# Patient Record
Sex: Female | Born: 1990 | Race: White | Hispanic: No | Marital: Single | Smoking: Never smoker
Health system: Southern US, Community
[De-identification: ages and names within clinical notes are randomized; demographics above are authoritative.]

## PROBLEM LIST (undated history)

## (undated) DIAGNOSIS — J45909 Unspecified asthma, uncomplicated: Secondary | ICD-10-CM

## (undated) DIAGNOSIS — K589 Irritable bowel syndrome without diarrhea: Secondary | ICD-10-CM

## (undated) DIAGNOSIS — F329 Major depressive disorder, single episode, unspecified: Secondary | ICD-10-CM

## (undated) DIAGNOSIS — I10 Essential (primary) hypertension: Secondary | ICD-10-CM

## (undated) DIAGNOSIS — F32A Depression, unspecified: Secondary | ICD-10-CM

## (undated) DIAGNOSIS — Z6841 Body Mass Index (BMI) 40.0 and over, adult: Secondary | ICD-10-CM

## (undated) DIAGNOSIS — T7840XA Allergy, unspecified, initial encounter: Secondary | ICD-10-CM

## (undated) DIAGNOSIS — Z8619 Personal history of other infectious and parasitic diseases: Secondary | ICD-10-CM

## (undated) DIAGNOSIS — K921 Melena: Secondary | ICD-10-CM

## (undated) HISTORY — DX: Depression, unspecified: F32.A

## (undated) HISTORY — DX: Essential (primary) hypertension: I10

## (undated) HISTORY — DX: Allergy, unspecified, initial encounter: T78.40XA

## (undated) HISTORY — DX: Melena: K92.1

## (undated) HISTORY — DX: Major depressive disorder, single episode, unspecified: F32.9

## (undated) HISTORY — DX: Body Mass Index (BMI) 40.0 and over, adult: Z684

## (undated) HISTORY — DX: Irritable bowel syndrome, unspecified: K58.9

## (undated) HISTORY — DX: Personal history of other infectious and parasitic diseases: Z86.19

## (undated) HISTORY — DX: Unspecified asthma, uncomplicated: J45.909

---

## 2014-04-05 ENCOUNTER — Ambulatory Visit (INDEPENDENT_AMBULATORY_CARE_PROVIDER_SITE_OTHER): Payer: BLUE CROSS/BLUE SHIELD | Admitting: Family Medicine

## 2014-04-05 ENCOUNTER — Encounter: Payer: Self-pay | Admitting: Family Medicine

## 2014-04-05 VITALS — BP 160/98 | HR 88 | Temp 98.4°F | Ht 63.25 in | Wt 256.1 lb

## 2014-04-05 DIAGNOSIS — R062 Wheezing: Secondary | ICD-10-CM

## 2014-04-05 DIAGNOSIS — R0600 Dyspnea, unspecified: Secondary | ICD-10-CM

## 2014-04-05 DIAGNOSIS — I10 Essential (primary) hypertension: Secondary | ICD-10-CM

## 2014-04-05 DIAGNOSIS — Z7189 Other specified counseling: Secondary | ICD-10-CM

## 2014-04-05 DIAGNOSIS — Z7689 Persons encountering health services in other specified circumstances: Secondary | ICD-10-CM

## 2014-04-05 DIAGNOSIS — J069 Acute upper respiratory infection, unspecified: Secondary | ICD-10-CM

## 2014-04-05 MED ORDER — LISINOPRIL-HYDROCHLOROTHIAZIDE 20-25 MG PO TABS: 1.0000 | ORAL_TABLET | Freq: Every day | ORAL | Status: DC

## 2014-04-05 MED ORDER — MOMETASONE FUROATE 50 MCG/ACT NA SUSP: 2.0000 | Freq: Every day | NASAL | Status: DC

## 2014-04-05 MED ORDER — ALBUTEROL SULFATE HFA 108 (90 BASE) MCG/ACT IN AERS: 2.0000 | INHALATION_SPRAY | Freq: Four times a day (QID) | RESPIRATORY_TRACT | Status: DC | PRN

## 2014-04-05 NOTE — Patient Instructions (Signed)
BEFORE YOU LEAVE: -schedule morning appointment in 1 month - come fasting but drink plenty of water  Get a pill box and take your new blood pressure medication every day  We recommend the following healthy lifestyle measures: - eat a healthy diet consisting of lots of vegetables, fruits, beans, nuts, seeds, healthy meats such as white chicken and fish and whole grains.  - avoid fried foods, fast food, processed foods, sodas, red meet and other fattening foods.  - get a least 150 minutes of aerobic exercise per week.   -We placed a referral for you as discussed for the lung tesing. It usually takes about 1-2 weeks to process and schedule this referral. If you have not heard from us regarding this appointment in 2 weeks please contact our office.  nasonex daily

## 2014-04-05 NOTE — Progress Notes (Signed)
HPI:  Kristy Ross is here to establish care. Moved here from [place]. 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 Last PCP and physical: she did have a pap smear last April 2015 and normal  Has the following chronic problems that require follow up and concerns today:   Mild SOB and wheezing -started about a year ago after using kaboom, never official testing for asthma but told has asthma -cigarette smoke and VURI trigger symptoms - has a cold currently -uses alb prn - a few times per week when a round cigarettes -hx of allergies, takes zyrtec -denies: CP, SOB now, orthopnea -brother with asthma  HTN/Obesity/HLD: -has been on medications since since a child -her whole family has HTN -meds: hctz 12.5 and moexipril 7.5 - forgets to take meds several days per week but feels like not high enough dose because always high -when she exercises does not need medications so has been on and off -reports cholesterol check with insurance several months ago and a little high and advised -she is working on diet and exercise  Hx of Depression: -as a teenager -never hospitalized, no hx SI -resolved several years ago and no issues since  Hx IBS: -ok if watches diet   ROS negative for unless reported above: fevers, unintentional weight loss, hearing or vision loss, chest pain, palpitations, struggling to breath, hemoptysis, melena, hematochezia, hematuria, falls, loc, si, thoughts of self harm  Past Medical History  Diagnosis Date  . Hypertension   . Asthma   . Allergy   . Depression   . IBS (irritable bowel syndrome)   . History of chicken pox   . Blood in stool     No past surgical history on file.  Family History  Problem Relation Age of Onset  . Hypertension Father   . Breast cancer Mother   . Hyperlipidemia Maternal Grandfather   . Hyperlipidemia Paternal Grandmother   . Hyperlipidemia Paternal Grandfather   . Heart disease Father   . CVA Maternal Grandfather      History   Social History  . Marital Status: Single    Spouse Name: N/A    Number of Children: N/A  . Years of Education: N/A   Social History Main Topics  . Smoking status: Never Smoker   . Smokeless tobacco: None  . Alcohol Use: No  . Drug Use: No  . Sexual Activity: None   Other Topics Concern  . 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  albuterol (PROVENTIL HFA;VENTOLIN HFA) 108 (90 BASE) MCG/ACT inhaler, Inhale 2 puffs into the lungs every 6 (six) hours as needed for wheezing or shortness of breath., Disp: 1 Inhaler, Rfl: 6 .  cetirizine (ZYRTEC) 10 MG tablet, Take 10 mg by mouth daily., Disp: , Rfl:  .  CRANBERRY PO, Take by mouth., Disp: , Rfl:  .  lisinopril-hydrochlorothiazide (PRINZIDE,ZESTORETIC) 20-25 MG per tablet, Take 1 tablet by mouth daily., Disp: 90 tablet, Rfl: 3 .  mometasone (NASONEX) 50 MCG/ACT nasal spray, Place 2 sprays into the nose daily., Disp: 17 g, Rfl: 12 .  OLIVE LEAF PO, Take by mouth daily., Disp: , Rfl:   EXAM:  Filed Vitals:   04/05/14 1624  BP: 160/98  Pulse: 88  Temp: 98.4 F (36.9 C)  O2:98%  Body mass index is 44.98 kg/(m^2).  GENERAL: vitals reviewed and listed above, alert, oriented, appears well hydrated and in no acute distress  HEENT: atraumatic, conjunttiva clear, no obvious abnormalities on inspection of external nose and ears, normal appearance of ear canals and TMs, clear nasal congestion, mild post oropharyngeal erythema with PND, no tonsillar edema or exudate, no sinus TTP  NECK: no obvious masses on inspection  LUNGS: clear to auscultation bilaterally, no wheezes, rales or rhonchi, good air movement  CV: HRRR, no peripheral edema  MS: moves all extremities without noticeable  abnormality  PSYCH: pleasant and cooperative, no obvious depression or anxiety  ASSESSMENT AND PLAN:  Discussed the following assessment and plan:  Essential hypertension - Plan: lisinopril-hydrochlorothiazide (PRINZIDE,ZESTORETIC) 20-25 MG per tablet -follow up in 1 month, labs then -stressed importance of diet and exercise and taking medication every day  Dyspnea - Plan: Pulmonary function test Wheezing - Plan: Pulmonary function test -flonase dialy, refilled alb  Viral upper respiratory infection -supportive care  Establish care -We reviewed the PMH, PSH, FH, SH, Meds and Allergies. -We provided refills for any medications we will prescribe as needed. -We addressed current concerns per orders and patient instructions. -We have asked for records for pertinent exams, studies, vaccines and notes from previous providers. -We have advised patient to follow up per instructions below.   -Patient advised to return or notify a doctor immediately if symptoms worsen or persist or new concerns arise.  Patient Instructions  BEFORE YOU LEAVE: -schedule morning appointment in 1 month - come fasting but drink plenty of water  Get a pill box and take your new blood pressure medication every day  We recommend the following healthy lifestyle measures: - eat a healthy diet consisting of lots of vegetables, fruits, beans, nuts, seeds, healthy meats such as white chicken and fish and whole grains.  - avoid fried foods, fast food, processed foods, sodas, red meet and other fattening foods.  - get a least 150 minutes of aerobic exercise per week.   -We placed a referral for you as discussed for the lung tesing. It usually takes about 1-2 weeks to process and schedule this referral. If you have not heard from us regarding this appointment in 2 weeks please contact our office.  nasonex daily         Kristy Ross R.

## 2014-04-05 NOTE — Progress Notes (Signed)
Pre visit review using our clinic review tool, if applicable. No additional management support is needed unless otherwise documented below in the visit note. 

## 2014-04-08 ENCOUNTER — Telehealth: Payer: Self-pay | Admitting: Family Medicine

## 2014-04-08 NOTE — Telephone Encounter (Signed)
emmi emailed °

## 2014-04-22 ENCOUNTER — Ambulatory Visit (INDEPENDENT_AMBULATORY_CARE_PROVIDER_SITE_OTHER): Payer: BLUE CROSS/BLUE SHIELD | Admitting: Internal Medicine

## 2014-04-22 DIAGNOSIS — R0600 Dyspnea, unspecified: Secondary | ICD-10-CM

## 2014-04-22 DIAGNOSIS — R062 Wheezing: Secondary | ICD-10-CM

## 2014-04-22 LAB — PULMONARY FUNCTION TEST
DL/VA % pred: 141 %
DL/VA: 6.63 ml/min/mmHg/L
DLCO UNC: 31.23 ml/min/mmHg
DLCO unc % pred: 136 %
FEF 25-75 POST: 3.46 L/s
FEF 25-75 PRE: 3.47 L/s
FEF2575-%Change-Post: 0 %
FEF2575-%PRED-PRE: 97 %
FEF2575-%Pred-Post: 97 %
FEV1-%CHANGE-POST: 0 %
FEV1-%PRED-POST: 96 %
FEV1-%Pred-Pre: 96 %
FEV1-Post: 3.05 L
FEV1-Pre: 3.04 L
FEV1FVC-%Change-Post: 2 %
FEV1FVC-%Pred-Pre: 97 %
FEV6-%Change-Post: -2 %
FEV6-%PRED-PRE: 99 %
FEV6-%Pred-Post: 97 %
FEV6-POST: 3.55 L
FEV6-PRE: 3.63 L
FEV6FVC-%PRED-POST: 100 %
FEV6FVC-%PRED-PRE: 100 %
FVC-%CHANGE-POST: -2 %
FVC-%PRED-PRE: 99 %
FVC-%Pred-Post: 96 %
FVC-PRE: 3.63 L
FVC-Post: 3.55 L
PRE FEV6/FVC RATIO: 100 %
Post FEV1/FVC ratio: 86 %
Post FEV6/FVC ratio: 100 %
Pre FEV1/FVC ratio: 84 %
RV % pred: 75 %
RV: 0.93 L
TLC % PRED: 94 %
TLC: 4.65 L

## 2014-04-22 NOTE — Progress Notes (Signed)
PFT performed today. 

## 2014-05-09 ENCOUNTER — Ambulatory Visit: Payer: BLUE CROSS/BLUE SHIELD | Admitting: Family Medicine

## 2014-05-10 ENCOUNTER — Ambulatory Visit (INDEPENDENT_AMBULATORY_CARE_PROVIDER_SITE_OTHER): Payer: BLUE CROSS/BLUE SHIELD | Admitting: Family Medicine

## 2014-05-10 ENCOUNTER — Encounter: Payer: Self-pay | Admitting: Family Medicine

## 2014-05-10 VITALS — BP 110/82 | HR 107 | Temp 98.0°F | Ht 63.25 in | Wt 247.0 lb

## 2014-05-10 DIAGNOSIS — I10 Essential (primary) hypertension: Secondary | ICD-10-CM

## 2014-05-10 DIAGNOSIS — Z114 Encounter for screening for human immunodeficiency virus [HIV]: Secondary | ICD-10-CM

## 2014-05-10 DIAGNOSIS — E669 Obesity, unspecified: Secondary | ICD-10-CM

## 2014-05-10 DIAGNOSIS — Z23 Encounter for immunization: Secondary | ICD-10-CM

## 2014-05-10 LAB — BASIC METABOLIC PANEL
BUN: 13 mg/dL (ref 6–23)
CHLORIDE: 102 meq/L (ref 96–112)
CO2: 28 mEq/L (ref 19–32)
Calcium: 9.6 mg/dL (ref 8.4–10.5)
Creatinine, Ser: 0.8 mg/dL (ref 0.40–1.20)
GFR: 92.88 mL/min (ref >60.00)
GLUCOSE: 83 mg/dL (ref 70–99)
POTASSIUM: 3.9 meq/L (ref 3.5–5.1)
Sodium: 136 mEq/L (ref 135–145)

## 2014-05-10 LAB — HEMOGLOBIN A1C: Hgb A1c MFr Bld: 5.6 % (ref 4.6–6.5)

## 2014-05-10 LAB — LIPID PANEL
Cholesterol: 150 mg/dL (ref 0–200)
HDL: 41.8 mg/dL (ref >39.00)
LDL Cholesterol: 85 mg/dL (ref 0–99)
NONHDL: 108.2
Total CHOL/HDL Ratio: 4
Triglycerides: 117 mg/dL (ref 0.0–149.0)
VLDL: 23.4 mg/dL (ref 0.0–40.0)

## 2014-05-10 NOTE — Patient Instructions (Addendum)
BEFORE YOU LEAVE: -labs -Tdap -follow up in 3 months  Congratulations on the lifestyle changed  We recommend the following healthy lifestyle measures: - eat a healthy diet consisting of lots of vegetables, fruits, beans, nuts, seeds, healthy meats such as white chicken and fish and whole grains.  - avoid fried foods, fast food, processed foods, sodas, red meet and other fattening foods.  - get a least 150 minutes of aerobic exercise per week.

## 2014-05-10 NOTE — Progress Notes (Signed)
Pre visit review using our clinic review tool, if applicable. No additional management support is needed unless otherwise documented below in the visit note. 

## 2014-05-10 NOTE — Addendum Note (Signed)
Addended by: Johnella MoloneyFUNDERBURK, JO A on: 05/10/2014 09:48 AM   Modules accepted: Orders

## 2014-05-10 NOTE — Progress Notes (Signed)
HPI:  Kristy Ross is a 24 yo pleasant F whom moved here in 2015 from [place]. She is here for follow up of several issues:  HTN: -meds: lisinopril-hctz 20-25 -long hx of HTN and strong FH -denies: CP, palpitations, swelling, HA, vision changes  Allergies/Dyspnea/Wheezing: -wheezing when exposed to smoke or or if exercises outside in the cold -informal dx mild intermittent asthma when came to me on alb prn and zyrtec for allergies -added INS and ordered PFT 03/2014; reviewed today -brother has asthma -denies: SOB, DOE, CP today  Obesity: -working on diet and exercise -9lb weight loss  HM: -HIV: wants to do today -pap: done -tdap: wants to do today  ROS: See pertinent positives and negatives per HPI.  Past Medical History  Diagnosis Date  . Hypertension   . Asthma   . Allergy   . Depression   . IBS (irritable bowel syndrome)   . History of chicken pox   . Blood in stool     No past surgical history on file.  Family History  Problem Relation Age of Onset  . Hypertension Father   . Breast cancer Mother   . Hyperlipidemia Maternal Grandfather   . Hyperlipidemia Paternal Grandmother   . Hyperlipidemia Paternal Grandfather   . Heart disease Father   . CVA Maternal Grandfather     History   Social History  . Marital Status: Single    Spouse Name: N/A  . Number of Children: N/A  . Years of Education: N/A   Social History Main Topics  . Smoking status: Never Smoker   . Smokeless tobacco: Not on file  . Alcohol Use: No  . Drug Use: No  . Sexual Activity: Not on file   Other Topics Concern  . 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  albuterol (PROVENTIL HFA;VENTOLIN HFA) 108 (90 BASE) MCG/ACT inhaler, Inhale 2 puffs  into the lungs every 6 (six) hours as needed for wheezing or shortness of breath., Disp: 1 Inhaler, Rfl: 6 .  cetirizine (ZYRTEC) 10 MG tablet, Take 10 mg by mouth daily., Disp: , Rfl:  .  CRANBERRY PO, Take by mouth., Disp: , Rfl:  .  lisinopril-hydrochlorothiazide (PRINZIDE,ZESTORETIC) 20-25 MG per tablet, Take 1 tablet by mouth daily., Disp: 90 tablet, Rfl: 3 .  mometasone (NASONEX) 50 MCG/ACT nasal spray, Place 2 sprays into the nose daily., Disp: 17 g, Rfl: 12  EXAM:  Filed Vitals:   05/10/14 0907  BP: 110/82  Pulse: 107  Temp: 98 F (36.7 C)    Body mass index is 43.38 kg/(m^2).  GENERAL: vitals reviewed and listed above, alert, oriented, appears well hydrated and in no acute distress  HEENT: atraumatic, conjunttiva clear, no obvious abnormalities on inspection of external nose and ears  NECK: no obvious masses on inspection  LUNGS: clear to auscultation bilaterally, no wheezes, rales or rhonchi, good air movement  CV: HRRR, no peripheral edema  MS: moves all extremities without noticeable abnormality  PSYCH: pleasant and cooperative, no obvious depression or anxiety  ASSESSMENT AND PLAN:  Discussed the following assessment and plan:  Obesity - Plan: Lipid Panel, Hemoglobin A1c  Essential hypertension - Plan: Basic  metabolic panel  Encounter for screening for HIV - Plan: HIV antibody (with reflex)  -congratulated on lifestyle changes -labs today -Patient advised to return or notify a doctor immediately if symptoms worsen or persist or new concerns arise.  Patient Instructions  BEFORE YOU LEAVE: -labs -follow up in 3 months  Congratulations on the lifestyle changed  We recommend the following healthy lifestyle measures: - eat a healthy diet consisting of lots of vegetables, fruits, beans, nuts, seeds, healthy meats such as white chicken and fish and whole grains.  - avoid fried foods, fast food, processed foods, sodas, red meet and other fattening foods.   - get a least 150 minutes of aerobic exercise per week.       Kriste BasqueKIM, Shabria Egley R.

## 2014-05-11 LAB — HIV ANTIBODY (ROUTINE TESTING W REFLEX): HIV 1&2 Ab, 4th Generation: NONREACTIVE

## 2014-08-10 ENCOUNTER — Ambulatory Visit (INDEPENDENT_AMBULATORY_CARE_PROVIDER_SITE_OTHER): Payer: BLUE CROSS/BLUE SHIELD | Admitting: Family Medicine

## 2014-08-10 ENCOUNTER — Encounter: Payer: Self-pay | Admitting: Family Medicine

## 2014-08-10 VITALS — BP 124/90 | HR 97 | Temp 97.6°F | Ht 63.25 in | Wt 256.6 lb

## 2014-08-10 DIAGNOSIS — Z9109 Other allergy status, other than to drugs and biological substances: Secondary | ICD-10-CM

## 2014-08-10 DIAGNOSIS — Z91048 Other nonmedicinal substance allergy status: Secondary | ICD-10-CM

## 2014-08-10 DIAGNOSIS — I10 Essential (primary) hypertension: Secondary | ICD-10-CM

## 2014-08-10 DIAGNOSIS — N926 Irregular menstruation, unspecified: Secondary | ICD-10-CM | POA: Diagnosis not present

## 2014-08-10 DIAGNOSIS — J4521 Mild intermittent asthma with (acute) exacerbation: Secondary | ICD-10-CM

## 2014-08-10 DIAGNOSIS — E669 Obesity, unspecified: Secondary | ICD-10-CM

## 2014-08-10 LAB — POCT URINE PREGNANCY: Preg Test, Ur: NEGATIVE

## 2014-08-10 NOTE — Progress Notes (Signed)
HPI:  HTN: -meds: lisinopril-hctz 20-25 -long hx of HTN and strong FH -denies: CP, palpitations, swelling, HA, vision changes  Allergies/Dyspnea/Wheezing: -wheezing when exposed to smoke or or if exercises outside in the cold -informal dx mild intermittent asthma when came to me on alb prn and zyrtec for allergies -added INS and ordered PFT 03/2014; reviewed today -brother has asthma -denies: SOB, DOE, CP today  Obesity: -working on exercise but has not been doing well with the diet -exercising 3 times per week and eating healthy  Irr menstrual bleeding: -FDLMP: April 3rd -has skipped periods before but this is rare -due for her gyn exam as well -has not been sexually active in years -no weight changes sig other then some loss from exercise - exercising a lot more, no spotting, no breast discharge or any other symptoms   ROS: See pertinent positives and negatives per HPI.  Past Medical History  Diagnosis Date  . Hypertension   . Asthma   . Allergy   . Depression   . IBS (irritable bowel syndrome)   . History of chicken pox   . Blood in stool     No past surgical history on file.  Family History  Problem Relation Age of Onset  . Hypertension Father   . Breast cancer Mother   . Hyperlipidemia Maternal Grandfather   . Hyperlipidemia Paternal Grandmother   . Hyperlipidemia Paternal Grandfather   . Heart disease Father   . CVA Maternal Grandfather     History   Social History  . Marital Status: Single    Spouse Name: N/A  . Number of Children: N/A  . Years of Education: N/A   Social History Main Topics  . Smoking status: Never Smoker   . Smokeless tobacco: Not on file  . Alcohol Use: No  . Drug Use: No  . Sexual Activity: Not on file   Other Topics Concern  . 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  albuterol (PROVENTIL HFA;VENTOLIN HFA) 108 (90 BASE) MCG/ACT inhaler, Inhale 2 puffs into the lungs every 6 (six) hours as needed for wheezing or shortness of breath., Disp: 1 Inhaler, Rfl: 6 .  cetirizine (ZYRTEC) 10 MG tablet, Take 10 mg by mouth daily., Disp: , Rfl:  .  fluticasone (FLONASE) 50 MCG/ACT nasal spray, Place into both nostrils daily., Disp: , Rfl:  .  lisinopril-hydrochlorothiazide (PRINZIDE,ZESTORETIC) 20-25 MG per tablet, Take 1 tablet by mouth daily., Disp: 90 tablet, Rfl: 3  EXAM:  Filed Vitals:   08/10/14 0854  BP: 124/90  Pulse: 97  Temp: 97.6 F (36.4 C)    Body mass index is 45.07 kg/(m^2).  GENERAL: vitals reviewed and listed above, alert, oriented, appears well hydrated and in no acute distress  HEENT: atraumatic, conjunttiva clear, no obvious abnormalities on inspection of external nose and ears  NECK: no obvious masses on inspection  LUNGS: clear to auscultation bilaterally, no wheezes, rales or rhonchi, good air movement  CV: HRRR, no peripheral edema  MS: moves all extremities without noticeable abnormality  PSYCH: pleasant and cooperative, no obvious depression or anxiety  ASSESSMENT AND PLAN:  Discussed the following assessment and plan:  Essential hypertension -stable  Environmental allergies  Asthma, mild intermittent  Obesity -lifestyle recs  Irregular menstrual bleeding - Plan: POCT urine pregnancy -upreg -she wants to do eval and gyn exam with a gynecologist - information provided  -Patient advised to return or notify a doctor immediately if symptoms worsen or persist or new concerns arise.  Patient Instructions  BEFORE YOU LEAVE: -schedule follow up in 4-6 months  We recommend the following healthy lifestyle measures: - eat a healthy diet consisting of lots of vegetables, fruits, beans, nuts, seeds, healthy meats such as white  chicken and fish   - avoid starches, sweets, fried foods, fast food, processed foods, sodas, red meet and other fattening foods.  - get a least 150 minutes of aerobic exercise per week.       Kristy BasqueKIM, HANNAH R.

## 2014-08-10 NOTE — Patient Instructions (Signed)
BEFORE YOU LEAVE: -schedule follow up in 4-6 months  We recommend the following healthy lifestyle measures: - eat a healthy diet consisting of lots of vegetables, fruits, beans, nuts, seeds, healthy meats such as white chicken and fish   - avoid starches, sweets, fried foods, fast food, processed foods, sodas, red meet and other fattening foods.  - get a least 150 minutes of aerobic exercise per week.

## 2014-08-10 NOTE — Progress Notes (Signed)
Pre visit review using our clinic review tool, if applicable. No additional management support is needed unless otherwise documented below in the visit note. 

## 2015-04-29 ENCOUNTER — Other Ambulatory Visit: Payer: Self-pay | Admitting: Family Medicine

## 2015-05-15 ENCOUNTER — Other Ambulatory Visit: Payer: Self-pay | Admitting: Family Medicine

## 2015-09-08 ENCOUNTER — Encounter: Payer: Self-pay | Admitting: Family Medicine

## 2015-09-08 ENCOUNTER — Encounter: Payer: BLUE CROSS/BLUE SHIELD | Admitting: Family Medicine

## 2015-09-08 ENCOUNTER — Ambulatory Visit (INDEPENDENT_AMBULATORY_CARE_PROVIDER_SITE_OTHER): Payer: BLUE CROSS/BLUE SHIELD | Admitting: Family Medicine

## 2015-09-08 VITALS — BP 102/80 | HR 85 | Temp 98.5°F | Ht 62.5 in | Wt 241.9 lb

## 2015-09-08 DIAGNOSIS — I10 Essential (primary) hypertension: Secondary | ICD-10-CM

## 2015-09-08 DIAGNOSIS — J309 Allergic rhinitis, unspecified: Secondary | ICD-10-CM | POA: Diagnosis not present

## 2015-09-08 DIAGNOSIS — Z0000 Encounter for general adult medical examination without abnormal findings: Secondary | ICD-10-CM | POA: Diagnosis not present

## 2015-09-08 DIAGNOSIS — E669 Obesity, unspecified: Secondary | ICD-10-CM

## 2015-09-08 DIAGNOSIS — J4521 Mild intermittent asthma with (acute) exacerbation: Secondary | ICD-10-CM

## 2015-09-08 LAB — BASIC METABOLIC PANEL
BUN: 12 mg/dL (ref 6–23)
CHLORIDE: 101 meq/L (ref 96–112)
CO2: 27 meq/L (ref 19–32)
Calcium: 9.9 mg/dL (ref 8.4–10.5)
Creatinine, Ser: 0.8 mg/dL (ref 0.40–1.20)
GFR: 91.91 mL/min (ref >60.00)
Glucose, Bld: 101 mg/dL — ABNORMAL HIGH (ref 70–99)
POTASSIUM: 3.7 meq/L (ref 3.5–5.1)
Sodium: 137 mEq/L (ref 135–145)

## 2015-09-08 LAB — CBC WITH DIFFERENTIAL/PLATELET
BASOS ABS: 0.1 10*3/uL (ref 0.0–0.1)
BASOS PCT: 0.8 % (ref 0.0–3.0)
Eosinophils Absolute: 0.8 10*3/uL — ABNORMAL HIGH (ref 0.0–0.7)
Eosinophils Relative: 8.3 % — ABNORMAL HIGH (ref 0.0–5.0)
HEMATOCRIT: 44.5 % (ref 36.0–46.0)
Hemoglobin: 15.2 g/dL — ABNORMAL HIGH (ref 12.0–15.0)
LYMPHS PCT: 24.8 % (ref 12.0–46.0)
Lymphs Abs: 2.5 10*3/uL (ref 0.7–4.0)
MCHC: 34.2 g/dL (ref 30.0–36.0)
MCV: 92.5 fl (ref 78.0–100.0)
MONOS PCT: 5.8 % (ref 3.0–12.0)
Monocytes Absolute: 0.6 10*3/uL (ref 0.1–1.0)
NEUTROS ABS: 6.2 10*3/uL (ref 1.4–7.7)
Neutrophils Relative %: 60.3 % (ref 43.0–77.0)
Platelets: 330 10*3/uL (ref 150.0–400.0)
RBC: 4.81 Mil/uL (ref 3.87–5.11)
RDW: 12.6 % (ref 11.5–15.5)
WBC: 10.2 10*3/uL (ref 4.0–10.5)

## 2015-09-08 MED ORDER — ALBUTEROL SULFATE (2.5 MG/3ML) 0.083% IN NEBU
2.5000 mg | INHALATION_SOLUTION | Freq: Once | RESPIRATORY_TRACT | Status: AC
  Administered 2015-09-08: 2.5 mg via RESPIRATORY_TRACT

## 2015-09-08 MED ORDER — HYDROCHLOROTHIAZIDE 25 MG PO TABS: 25.0000 mg | ORAL_TABLET | Freq: Every day | ORAL | Status: DC

## 2015-09-08 MED ORDER — ALBUTEROL SULFATE HFA 108 (90 BASE) MCG/ACT IN AERS: INHALATION_SPRAY | RESPIRATORY_TRACT | Status: DC

## 2015-09-08 NOTE — Progress Notes (Signed)
Pre visit review using our clinic review tool, if applicable. No additional management support is needed unless otherwise documented below in the visit note. 

## 2015-09-08 NOTE — Patient Instructions (Addendum)
BEFORE YOU LEAVE: -albuterol treatment -labs -nurse visit in 1 month to check blood pressure; schedule follow up in 6 months  Stop current blood pressure medication. New prescription for hydrochlorothiazide sent to pharmacy.  Congratulations on the healthy lifestyle changes!!!  Restart zyrtec.  Use the albuterol as needed and follow up if any worsening or persistent symptoms.  Vit D3 [phone] IU daily  We recommend the following healthy lifestyle measures: - eat a healthy whole foods diet consisting of regular small meals composed of vegetables, fruits, beans, nuts, seeds, healthy meats such as white chicken and fish and whole grains.  - avoid sweets, white starchy foods, fried foods, fast food, processed foods, sodas, red meet and other fattening foods.  - get a least 150-300 minutes of aerobic exercise per week.   We have ordered labs or studies at this visit. It can take up to 1-2 weeks for results and processing. IF results require follow up or explanation, we will call you with instructions. Clinically stable results will be released to your [facility]. If you have not heard from us or cannot find your results in [facility] in 2 weeks please contact our office at [phone].  If you are not yet signed up for [facility], please consider signing up.

## 2015-09-08 NOTE — Progress Notes (Signed)
HPI:  Here for CPE:  -Concerns and/or follow up today:   HTN: -meds: lisinopril-hctz 20-25 -long hx of HTN and strong FH; on BP meds since 8th grade -denies: CP, palpitations, swelling, HA, vision changes  Allergies/Dyspnea/Wheezing: -informal dx mild intermittent asthma when came to me on alb prn and zyrtec for allergies -added INS and ordered PFT 03/2014 -brother has asthma -ran out of zyrtec and has allergy issues this time of year, had some wheezing last night and is out of albuterol -denies: SOB, DOE, CP today  Obesity: -started wt watchers 3 months ago and is very happy has lost 20 lbs -exercising 2-5 days per week  -Diet: variety of foods, balance and well rounded, larger portion sizes  -Exercise: no regular exercise  -Taking folic acid, vitamin D or calcium: no  -Diabetes and Dyslipidemia Screening: done last year and at wrk every fall - neg per her report  -Hx of HTN: no  -Vaccines: UTD  -pap history: 2015, normal - all normal since age 1  -FDLMP: 09/07/15  -sexual activity: no  -wants STI testing (Hep C if born [birth year]): no  -FH breast, colon or ovarian ca: see FH Last mammogram: n/a Last colon cancer screening: n/a  Breast Ca Risk Assessment: -mother had breast ca, she had genetic testing which was negative, pt does self breast exams, declines CBE today.  -Alcohol, Tobacco, drug use: see social history  Review of Systems - no fevers, unintentional weight loss, vision loss, hearing loss, chest pain, sob, hemoptysis, melena, hematochezia, hematuria, genital discharge, changing or concerning skin lesions, bleeding, bruising, loc, thoughts of self harm or SI  Past Medical History  Diagnosis Date  . Hypertension   . Asthma   . Allergy   . Depression   . IBS (irritable bowel syndrome)   . History of chicken pox   . Blood in stool     No past surgical history on file.  Family History  Problem Relation Age of Onset  . Hypertension Father   .  Breast cancer Mother   . Hyperlipidemia Maternal Grandfather   . Hyperlipidemia Paternal Grandmother   . Hyperlipidemia Paternal Grandfather   . Heart disease Father   . CVA Maternal Grandfather     Social History   Social History  . Marital Status: Single    Spouse Name: N/A  . Number of Children: N/A  . Years of Education: N/A   Social History Main Topics  . Smoking status: Never Smoker   . Smokeless tobacco: None  . Alcohol Use: No  . Drug Use: No  . Sexual Activity: Not Asked   Other Topics Concern  . 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  albuterol (PROAIR HFA) 108 (90 Base) MCG/ACT inhaler, INHALE TWO PUFFS BY MOUTH EVERY SIX HOURS AS NEEDED FOR WHEEZING OR SHORTNESS OF BREATH., Disp: 8.5 Inhaler, Rfl: 5 .  cetirizine (ZYRTEC) 10 MG tablet, Take 10 mg by mouth daily., Disp: , Rfl:  .  fluticasone (FLONASE) 50 MCG/ACT nasal spray, Place into both nostrils daily., Disp: , Rfl:  .  hydrochlorothiazide (HYDRODIURIL) 25 MG tablet, Take 1 tablet (25 mg total) by mouth daily., Disp:  90 tablet, Rfl: 3  EXAM:  Filed Vitals:   09/08/15 0815  BP: 102/80  Pulse: 85  Temp: 98.5 F (36.9 C)    GENERAL: vitals reviewed and listed below, alert, oriented, appears well hydrated and in no acute distress  HEENT: head atraumatic, PERRLA, normal appearance of eyes, ears, nose and mouth. moist mucus membranes.  NECK: supple, no masses or lymphadenopathy  LUNGS: difuse exp wheezing; normal exam o/w, O2 sats normal  CV: HRRR, no peripheral edema or cyanosis, normal pedal pulses  BREAST: declined  ABDOMEN: bowel sounds normal, soft, non tender to palpation, no masses, no rebound or guarding  GU: declined  SKIN: no rash or abnormal  lesions; declined full skin exam  MS: normal gait, moves all extremities normally  NEURO: CN II-XII grossly intact, normal muscle strength and sensation to light touch on extremities  PSYCH: normal affect, pleasant and cooperative  ASSESSMENT AND PLAN:  Discussed the following assessment and plan:  1. Visit for preventive health examination -Discussed and advised all [place] preventive services health task force level A and B recommendations for age, sex and risks. -Advised at least 150 minutes of exercise per week and a healthy diet low in saturated fats and sweets and consisting of fresh fruits and vegetables, lean meats such as fish and white chicken and whole grains. -FASTING labs, studies and vaccines per orders this encounter -advised self breast exams, advised pap next year  2. Asthma with acute exacerbation, mild intermittent - neb tx with resolution wheezing, feels well -alb rx for prn use -restart zyrtec -follow up advised if not resolved with tx, worsening or any concerns -stop lisinopril  3. Allergic rhinitis, unspecified allergic rhinitis type -restart zyrtec  4. Obesity -congratulated on lifestyle changes -encouraged lifelong commitment to healthy lifestyle  5. Essential hypertension - Basic metabolic panel - CBC with Differential/Platelets -stop lisinopril with asthma; may not need - recheck in 1 month    Orders Placed This Encounter  Procedures  . Basic metabolic panel  . CBC with Differential/Platelets    Patient advised to return to clinic immediately if symptoms worsen or persist or new concerns.  Patient Instructions  BEFORE YOU LEAVE: -albuterol treatment -labs -nurse visit in 1 month to check blood pressure; schedule follow up in 6 months  Stop current blood pressure medication. New prescription for hydrochlorothiazide sent to pharmacy.  Congratulations on the healthy lifestyle changes!!!  Restart zyrtec.  Use the albuterol as needed and  follow up if any worsening or persistent symptoms.  Vit D3 [id number] IU daily  We recommend the following healthy lifestyle measures: - eat a healthy whole foods diet consisting of regular small meals composed of vegetables, fruits, beans, nuts, seeds, healthy meats such as white chicken and fish and whole grains.  - avoid sweets, white starchy foods, fried foods, fast food, processed foods, sodas, red meet and other fattening foods.  - get a least 150-300 minutes of aerobic exercise per week.   We have ordered labs or studies at this visit. It can take up to 1-2 weeks for results and processing. IF results require follow up or explanation, we will call you with instructions. Clinically stable results will be released to your [facility]. If you have not heard from us or cannot find your results in [facility] in 2 weeks please contact our office at [phone].  If you are not yet signed up for [facility], please consider signing up.           No Follow-up  on file.  Kriste BasqueKIM, Ondrea Dow R., DO

## 2015-09-08 NOTE — Addendum Note (Signed)
Addended by: Johnella MoloneyFUNDERBURK, Delena Casebeer A on: 09/08/2015 09:03 AM   Modules accepted: Orders

## 2015-10-06 ENCOUNTER — Ambulatory Visit (INDEPENDENT_AMBULATORY_CARE_PROVIDER_SITE_OTHER): Payer: BLUE CROSS/BLUE SHIELD | Admitting: *Deleted

## 2015-10-06 VITALS — BP 140/92 | HR 90

## 2015-10-06 DIAGNOSIS — I10 Essential (primary) hypertension: Secondary | ICD-10-CM

## 2015-10-06 MED ORDER — HYDROCHLOROTHIAZIDE 25 MG PO TABS: 25.0000 mg | ORAL_TABLET | Freq: Every day | ORAL | 3 refills | Status: DC

## 2015-10-06 MED ORDER — LOSARTAN POTASSIUM 50 MG PO TABS: 50.0000 mg | ORAL_TABLET | Freq: Every day | ORAL | 3 refills | Status: DC

## 2015-10-06 NOTE — Progress Notes (Signed)
HPI:  Follow up HTN: -longstanding -here for nurse check, stopped lisinopril last visit as bp low an asthma symptoms -reports breathing much better of lisinopril, howevere BP up at nurse visit and going out of town so worked her in today -denies: CP, SOB, HA, vision changes  ROS: See pertinent positives and negatives per HPI.  Past Medical History:  Diagnosis Date  . Allergy   . Asthma   . Blood in stool   . Depression   . History of chicken pox   . Hypertension   . IBS (irritable bowel syndrome)     No past surgical history on file.  Family History  Problem Relation Age of Onset  . Hypertension Father   . Breast cancer Mother   . Hyperlipidemia Maternal Grandfather   . Hyperlipidemia Paternal Grandmother   . Hyperlipidemia Paternal Grandfather   . Heart disease Father   . CVA Maternal Grandfather     Social History   Social History  . Marital status: Single    Spouse name: N/A  . Number of children: N/A  . Years of education: N/A   Social History Main Topics  . Smoking status: Never Smoker  . Smokeless tobacco: Not on file  . Alcohol use No  . Drug use: No  . Sexual activity: Not on file   Other Topics Concern  . 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  albuterol (PROAIR HFA) 108 (90 Base) MCG/ACT inhaler, INHALE TWO PUFFS BY MOUTH EVERY SIX HOURS AS NEEDED FOR WHEEZING OR SHORTNESS OF BREATH., Disp: 8.5 Inhaler, Rfl: 5 .  cetirizine (ZYRTEC) 10 MG tablet, Take 10 mg by mouth daily., Disp: , Rfl:  .  fluticasone (FLONASE) 50 MCG/ACT nasal spray, Place into both nostrils daily., Disp: , Rfl:  .  hydrochlorothiazide (HYDRODIURIL) 25 MG tablet, Take 1 tablet (25 mg total) by mouth daily., Disp: 90 tablet, Rfl:  3 .  losartan (COZAAR) 50 MG tablet, Take 1 tablet (50 mg total) by mouth daily., Disp: 90 tablet, Rfl: 3  EXAM:  Vitals:   10/06/15 1001  BP: (!) 140/92  Pulse: 90    There is no height or weight on file to calculate BMI.  GENERAL: vitals reviewed and listed above, alert, oriented, appears well hydrated and in no acute distress  HEENT: atraumatic, conjunttiva clear, no obvious abnormalities on inspection of external nose and ears  NECK: no obvious masses on inspection  LUNGS: clear to auscultation bilaterally, no wheezes, rales or rhonchi, good air movement  CV: HRRR, no peripheral edema  MS: moves all extremities without noticeable abnormality  PSYCH: pleasant and cooperative, no obvious depression or anxiety  ASSESSMENT AND PLAN:  Discussed the following assessment and plan:  Essential hypertension  -discussed options and risks -opted to add losartan - lifestyle recs -follow up 1-2 months -Patient advised to return or notify a doctor immediately if symptoms worsen or persist or new concerns arise.  Patient Instructions  BEFORE YOU LEAVE: -follow up: 1-2 months   Start the Losartan 50 mg and take once daily.  Continue the Hctz.  We recommend the following healthy lifestyle: 1) Small portions - eat  off of salad plate instead of dinner plate 2) Eat a healthy clean diet with avoidance of (less then 1 serving per week) processed foods, sweetened drinks, white starches, red meat, fast foods and sweets and consisting of: * 5-9 servings per day of fresh or frozen fruits and vegetables (not corn or potatoes, not dried or canned) *nuts and seeds, beans *olives and olive oil *small portions of lean meats such as fish and white chicken  *small portions of whole grains 3)Get at least 150 minutes of sweaty aerobic exercise per week 4)reduce stress - counseling, meditation, relaxation to balance other aspects of your life     Kriste Basque R., DO

## 2015-10-06 NOTE — Patient Instructions (Addendum)
BEFORE YOU LEAVE: -follow up: 1-2 months   Start the Losartan 50 mg and take once daily.  Continue the Hctz.  We recommend the following healthy lifestyle: 1) Small portions - eat off of salad plate instead of dinner plate 2) Eat a healthy clean diet with avoidance of (less then 1 serving per week) processed foods, sweetened drinks, white starches, red meat, fast foods and sweets and consisting of: * 5-9 servings per day of fresh or frozen fruits and vegetables (not corn or potatoes, not dried or canned) *nuts and seeds, beans *olives and olive oil *small portions of lean meats such as fish and white chicken  *small portions of whole grains 3)Get at least 150 minutes of sweaty aerobic exercise per week 4)reduce stress - counseling, meditation, relaxation to balance other aspects of your life

## 2015-12-04 ENCOUNTER — Encounter: Payer: Self-pay | Admitting: Family Medicine

## 2015-12-04 ENCOUNTER — Ambulatory Visit (INDEPENDENT_AMBULATORY_CARE_PROVIDER_SITE_OTHER): Payer: BLUE CROSS/BLUE SHIELD | Admitting: Family Medicine

## 2015-12-04 VITALS — BP 102/76 | HR 98 | Temp 98.5°F | Ht 62.5 in | Wt 236.0 lb

## 2015-12-04 DIAGNOSIS — J4520 Mild intermittent asthma, uncomplicated: Secondary | ICD-10-CM | POA: Diagnosis not present

## 2015-12-04 DIAGNOSIS — Z6841 Body Mass Index (BMI) 40.0 and over, adult: Secondary | ICD-10-CM | POA: Diagnosis not present

## 2015-12-04 DIAGNOSIS — I10 Essential (primary) hypertension: Secondary | ICD-10-CM

## 2015-12-04 DIAGNOSIS — R739 Hyperglycemia, unspecified: Secondary | ICD-10-CM

## 2015-12-04 DIAGNOSIS — J302 Other seasonal allergic rhinitis: Secondary | ICD-10-CM

## 2015-12-04 HISTORY — DX: Body Mass Index (BMI) 40.0 and over, adult: Z684

## 2015-12-04 NOTE — Patient Instructions (Signed)
BEFORE YOU LEAVE: -follow up: 3-4 months  keep up the great work!  May cut one blood pressure medication in half if you feel blood pressure is running low.   We recommend the following healthy lifestyle for LIFE: 1) Small portions.   Tip: eat off of a salad plate instead of a dinner plate.  Tip: It is ok to feel hungry after a meal - that likely means you ate an appropriate portion.  Tip: if you need more or a snack choose fruits, veggies and/or a handful of nuts or seeds.  2) Eat a healthy clean diet.  * Tip: Avoid (less then 1 serving per week): processed foods, sweets, sweetened drinks, white starches (rice, flour, bread, potatoes, pasta, etc), red meat, fast foods, butter  *Tip: CHOOSE instead   * 5-9 servings per day of fresh or frozen fruits and vegetables (but not corn, potatoes, bananas, canned or dried fruit)   *nuts and seeds, beans   *olives and olive oil   *small portions of lean meats such as fish and white chicken    *small portions of whole grains  3)Get at least 150 minutes of sweaty aerobic exercise per week.  4)Reduce stress - consider counseling, meditation and relaxation to balance other aspects of your life.

## 2015-12-04 NOTE — Progress Notes (Signed)
Pre visit review using our clinic review tool, if applicable. No additional management support is needed unless otherwise documented below in the visit note. 

## 2015-12-04 NOTE — Progress Notes (Signed)
HPI:  Follow up:  HTN: -meds: hctz (stopped lisinopril 6/17 due to asthma), losartan 50 -long hx of HTN and strong FH; on BP meds since 8th grade -reports feels much better on losartan -denies: CP, palpitations, swelling, HA, vision changes  Allergies/Dyspnea/Wheezing: -informal dx mild intermittent asthma when came to me -meds: alb, zyrtec restarted last visit -added INS and ordered PFT 03/2014 -brother has asthma -denies: SOB, DOE, CP today  Obesity/mild hyperglycemia: -started wt watchers 05/2015 -exercising 2-5 days per week -wt 256 (6/16)  --> 241 --> 236!  ROS: See pertinent positives and negatives per HPI.  Past Medical History:  Diagnosis Date  . Allergy   . Asthma   . Blood in stool   . BMI 40.0-44.9, adult (HCC) 12/04/2015  . Depression   . History of chicken pox   . Hypertension   . IBS (irritable bowel syndrome)     No past surgical history on file.  Family History  Problem Relation Age of Onset  . Hypertension Father   . Breast cancer Mother   . Hyperlipidemia Maternal Grandfather   . Hyperlipidemia Paternal Grandmother   . Hyperlipidemia Paternal Grandfather   . Heart disease Father   . CVA Maternal Grandfather     Social History   Social History  . Marital status: Single    Spouse name: N/A  . Number of children: N/A  . Years of education: N/A   Social History Main Topics  . Smoking status: Never Smoker  . Smokeless tobacco: None  . Alcohol use No  . Drug use: No  . Sexual activity: Not Asked   Other Topics Concern  . 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  albuterol (PROAIR HFA) 108 (90 Base) MCG/ACT inhaler, INHALE TWO PUFFS BY MOUTH EVERY SIX HOURS AS NEEDED FOR WHEEZING OR SHORTNESS OF  BREATH., Disp: 8.5 Inhaler, Rfl: 5 .  cetirizine (ZYRTEC) 10 MG tablet, Take 10 mg by mouth daily., Disp: , Rfl:  .  fluticasone (FLONASE) 50 MCG/ACT nasal spray, Place into both nostrils daily., Disp: , Rfl:  .  hydrochlorothiazide (HYDRODIURIL) 25 MG tablet, Take 1 tablet (25 mg total) by mouth daily., Disp: 90 tablet, Rfl: 3 .  losartan (COZAAR) 50 MG tablet, Take 1 tablet (50 mg total) by mouth daily., Disp: 90 tablet, Rfl: 3  EXAM:  Vitals:   12/04/15 0814  BP: 102/76  Pulse: 98  Temp: 98.5 F (36.9 C)    Body mass index is 42.48 kg/m.  GENERAL: vitals reviewed and listed above, alert, oriented, appears well hydrated and in no acute distress  HEENT: atraumatic, conjunttiva clear, no obvious abnormalities on inspection of external nose and ears  NECK: no obvious masses on inspection  LUNGS: clear to auscultation bilaterally, no wheezes, rales or rhonchi, good air movement  CV: HRRR, no peripheral edema  MS: moves all extremities without noticeable abnormality  PSYCH: pleasant and cooperative, no obvious depression or anxiety  ASSESSMENT AND PLAN:  Discussed the following assessment and plan:  Essential hypertension  Seasonal allergies  Mild intermittent asthma, uncomplicated  BMI 40.0-44.9, adult (HCC)  Hyperglycemia -she did glu check at work a few weeks ago -  lifestyle recs, congratulated on progress! She wants to be on wall of fame with full name and accomplishments/tactics for healthy lifestyle -flu shot advised, she will get at work, declined -follow up 3-4 months with labs then -Patient advised to return or notify a doctor immediately if symptoms worsen or persist or new concerns arise.   Kriste BasqueKIM, HANNAH R., DO

## 2016-02-10 NOTE — Progress Notes (Signed)
HPI:  Kristy Ross is a pleasant 25 yo with a PMH significant for obesity, HTN, asthma, allergies and hyperlipidemia her for follow up. See summary of her chronic medical problems below. Due for labs today. She has a new complaint of sinus congestion, upper respiratory symptoms and asthma symptoms. These started about 2 weeks ago and have worsened. She has nasal congestion, cough, postnasal drip and intermittent wheezing. She has now developed maxillary sinus pressure and pain. She is out of her albuterol and has not used any treatments for this. Denies fevers, vomiting, shortness of breath or hemoptysis.  HTN: -meds: hctz (stopped lisinopril 6/17 due to asthma), losartan 50 -long hx of HTN and strong FH; on BP meds since 8th grade -feels much better on losartan  Allergies/Dyspnea/Wheezing: -informal dx mild intermittent asthma when came to me -meds: alb, zyrtec restarted  -added INS and ordered PFT 03/2014 -brother has asthma  Obesity/mild hyperglycemia: -started wt watchers 05/2015 -exercising 2-5 days per week -wt 256 (6/16)  --> 241 --> 238 today  ROS: See pertinent positives and negatives per HPI.  Past Medical History:  Diagnosis Date  . Allergy   . Asthma   . Blood in stool   . BMI 40.0-44.9, adult (HCC) 12/04/2015  . Depression   . History of chicken pox   . Hypertension   . IBS (irritable bowel syndrome)     No past surgical history on file.  Family History  Problem Relation Age of Onset  . Hypertension Father   . Breast cancer Mother   . Hyperlipidemia Maternal Grandfather   . Hyperlipidemia Paternal Grandmother   . Hyperlipidemia Paternal Grandfather   . Heart disease Father   . CVA Maternal Grandfather     Social History   Social History  . Marital status: Single    Spouse name: N/A  . Number of children: N/A  . Years of education: N/A   Social History Main Topics  . Smoking status: Never Smoker  . Smokeless tobacco: None  . Alcohol use No  .  Drug use: No  . Sexual activity: Not Asked   Other Topics Concern  . None   Social History Narrative   Work or School: Paediatric nursedevelopment assistant for [employer]      Home Situation: lives alone      Spiritual Beliefs: Christian      Lifestyle: no regular exercise - but starting to work on this and diet           Current Outpatient Prescriptions:  .  cetirizine (ZYRTEC) 10 MG tablet, Take 10 mg by mouth daily., Disp: , Rfl:  .  fluticasone (FLONASE) 50 MCG/ACT nasal spray, Place into both nostrils daily., Disp: , Rfl:  .  hydrochlorothiazide (HYDRODIURIL) 25 MG tablet, Take 1 tablet (25 mg total) by mouth daily., Disp: 90 tablet, Rfl: 3 .  losartan (COZAAR) 50 MG tablet, Take 1 tablet (50 mg total) by mouth daily., Disp: 90 tablet, Rfl: 3 .  albuterol (PROVENTIL HFA;VENTOLIN HFA) 108 (90 Base) MCG/ACT inhaler, Inhale 2 puffs into the lungs every 6 (six) hours as needed., Disp: 1 Inhaler, Rfl: 0 .  doxycycline (VIBRAMYCIN) 100 MG capsule, Take 1 capsule (100 mg total) by mouth 2 (two) times daily., Disp: 20 capsule, Rfl: 0 .  predniSONE (DELTASONE) 20 MG tablet, Take 2 tablets (40 mg total) by mouth daily with breakfast., Disp: 8 tablet, Rfl: 0  EXAM:  Vitals:   02/12/16 0826  BP: 102/80  Pulse: (!) 107  Temp: 98.4  F (36.9 C)    Body mass index is 42.87 kg/m.  GENERAL: vitals reviewed and listed above, alert, oriented, appears well hydrated and in no acute distress  HEENT: atraumatic, conjunttiva clear, no obvious abnormalities on inspection of external nose and ears  NECK: no obvious masses on inspection  LUNGS: clear to auscultation bilaterally, no wheezes, rales or rhonchi, good air movement  CV: HRRR, no peripheral edema  MS: moves all extremities without noticeable abnormality  PSYCH: pleasant and cooperative, no obvious depression or anxiety  ASSESSMENT AND PLAN:  Discussed the following assessment and plan:  Acute non-recurrent maxillary sinusitis  Mild  intermittent asthma with acute exacerbation  Essential hypertension - Plan: Basic metabolic panel, CBC (no diff)  BMI 40.0-44.9, adult (HCC)  -Treat sinus infection with doxycycline -Albuterol for wheezing, prednisone worsening asthma symptoms or symptoms not responding to albuterol -Discussed risks with the medications -Labs for chronic medical problems -Blood pressure remains elevated on recheck today, we'll plan to check when she is feeling better and adjust medications as needed -Lifestyle recommendations  -Patient advised to return or notify a doctor immediately if symptoms worsen or persist or new concerns arise.  Patient Instructions  BEFORE YOU LEAVE: -labs -follow up: 3 months  Take the doxycycline (antibiotic) for the sinus infection. You can not take this antibiotic if pregnant. Do not get in the sun.  Use the albuterol as needed for asthma symptoms per instructions. Take the prednisone if asthma symptoms not improving or not responding to the albuterol.  Follow up if symptoms worsen or persist despite treatment.  We have ordered labs or studies at this visit. It can take up to 1-2 weeks for results and processing. IF results require follow up or explanation, we will call you with instructions. Clinically stable results will be released to your [facility]. If you have not heard from us or cannot find your results in [facility] in 2 weeks please contact our office at [phone].   If you are not yet signed up for [facility], please SIGN UP TODAY. We now offer online scheduling, same day appointments and extended hours. WHEN YOU DON'T FEEL YOUR BEST.Marland Kitchen.Marland Kitchen.WE ARE HERE TO HELP.          Kriste BasqueKIM, HANNAH R., DO

## 2016-02-12 ENCOUNTER — Encounter: Payer: Self-pay | Admitting: Family Medicine

## 2016-02-12 ENCOUNTER — Ambulatory Visit (INDEPENDENT_AMBULATORY_CARE_PROVIDER_SITE_OTHER): Payer: BLUE CROSS/BLUE SHIELD | Admitting: Family Medicine

## 2016-02-12 VITALS — BP 100/80 | HR 107 | Temp 98.4°F | Ht 62.5 in | Wt 238.2 lb

## 2016-02-12 DIAGNOSIS — J4521 Mild intermittent asthma with (acute) exacerbation: Secondary | ICD-10-CM

## 2016-02-12 DIAGNOSIS — I10 Essential (primary) hypertension: Secondary | ICD-10-CM | POA: Diagnosis not present

## 2016-02-12 DIAGNOSIS — J0100 Acute maxillary sinusitis, unspecified: Secondary | ICD-10-CM | POA: Diagnosis not present

## 2016-02-12 DIAGNOSIS — Z6841 Body Mass Index (BMI) 40.0 and over, adult: Secondary | ICD-10-CM | POA: Diagnosis not present

## 2016-02-12 LAB — BASIC METABOLIC PANEL
BUN: 14 mg/dL (ref 6–23)
CHLORIDE: 104 meq/L (ref 96–112)
CO2: 26 meq/L (ref 19–32)
CREATININE: 0.81 mg/dL (ref 0.40–1.20)
Calcium: 9.4 mg/dL (ref 8.4–10.5)
GFR: 90.31 mL/min (ref >60.00)
Glucose, Bld: 90 mg/dL (ref 70–99)
POTASSIUM: 3.8 meq/L (ref 3.5–5.1)
SODIUM: 138 meq/L (ref 135–145)

## 2016-02-12 LAB — CBC
HEMATOCRIT: 43.6 % (ref 36.0–46.0)
Hemoglobin: 15 g/dL (ref 12.0–15.0)
MCHC: 34.3 g/dL (ref 30.0–36.0)
MCV: 91.4 fl (ref 78.0–100.0)
PLATELETS: 356 10*3/uL (ref 150.0–400.0)
RBC: 4.77 Mil/uL (ref 3.87–5.11)
RDW: 13.5 % (ref 11.5–15.5)
WBC: 7.6 10*3/uL (ref 4.0–10.5)

## 2016-02-12 MED ORDER — PREDNISONE 20 MG PO TABS: 40.0000 mg | ORAL_TABLET | Freq: Every day | ORAL | 0 refills | Status: DC

## 2016-02-12 MED ORDER — ALBUTEROL SULFATE HFA 108 (90 BASE) MCG/ACT IN AERS: 2.0000 | INHALATION_SPRAY | Freq: Four times a day (QID) | RESPIRATORY_TRACT | 0 refills | Status: DC | PRN

## 2016-02-12 MED ORDER — DOXYCYCLINE HYCLATE 100 MG PO CAPS: 100.0000 mg | ORAL_CAPSULE | Freq: Two times a day (BID) | ORAL | 0 refills | Status: DC

## 2016-02-12 NOTE — Patient Instructions (Signed)
BEFORE YOU LEAVE: -labs -follow up: 3 months  Take the doxycycline (antibiotic) for the sinus infection. You can not take this antibiotic if pregnant. Do not get in the sun.  Use the albuterol as needed for asthma symptoms per instructions. Take the prednisone if asthma symptoms not improving or not responding to the albuterol.  Follow up if symptoms worsen or persist despite treatment.  We have ordered labs or studies at this visit. It can take up to 1-2 weeks for results and processing. IF results require follow up or explanation, we will call you with instructions. Clinically stable results will be released to your [facility]. If you have not heard from us or cannot find your results in [facility] in 2 weeks please contact our office at [phone].   If you are not yet signed up for [facility], please SIGN UP TODAY. We now offer online scheduling, same day appointments and extended hours. WHEN YOU DON'T FEEL YOUR BEST.Marland Kitchen.Marland Kitchen.WE ARE HERE TO HELP.

## 2016-02-12 NOTE — Progress Notes (Signed)
Pre visit review using our clinic review tool, if applicable. No additional management support is needed unless otherwise documented below in the visit note. 

## 2016-02-16 ENCOUNTER — Ambulatory Visit: Payer: BLUE CROSS/BLUE SHIELD | Admitting: Family Medicine

## 2016-02-23 ENCOUNTER — Ambulatory Visit: Payer: BLUE CROSS/BLUE SHIELD | Admitting: Family Medicine

## 2016-04-04 ENCOUNTER — Ambulatory Visit: Payer: BLUE CROSS/BLUE SHIELD | Admitting: Family Medicine

## 2016-05-13 ENCOUNTER — Ambulatory Visit: Payer: BLUE CROSS/BLUE SHIELD | Admitting: Family Medicine

## 2016-09-26 ENCOUNTER — Other Ambulatory Visit: Payer: Self-pay | Admitting: Family Medicine

## 2016-12-26 ENCOUNTER — Other Ambulatory Visit: Payer: Self-pay | Admitting: Family Medicine

## 2017-01-01 DIAGNOSIS — J209 Acute bronchitis, unspecified: Secondary | ICD-10-CM | POA: Diagnosis not present

## 2017-01-01 DIAGNOSIS — H6501 Acute serous otitis media, right ear: Secondary | ICD-10-CM | POA: Diagnosis not present

## 2017-01-01 DIAGNOSIS — R062 Wheezing: Secondary | ICD-10-CM | POA: Diagnosis not present

## 2017-01-12 NOTE — Progress Notes (Signed)
HPI:  Was supposed to have a physical, but is on her period so prefers a follow up to address several issues today.  Depression: -reports chronic on and off since age 26 -on zoloft and prozac in the past -reports would prefer to treat without medications -no SI, hallucinations, thoughts of self harm or manic symptoms -anhedonia, mild depressed mood for about 1 year, anxiety   HTN: -meds: hctz (stopped lisinopril 6/17 due to asthma), losartan 50 -long hx of HTN and strong FH; on BP meds since 8th grade -reports feels much better on losartan -denies: CP, palpitations, swelling, HA, vision changes  Allergies/Dyspnea/Wheezing: -informal dx mild intermittent asthma when came to me -meds: alb, zyrtec, flonase -PFT 03/2014 with minimal obstruction -brother has asthma -denies: SOB, DOE, CP today  Obesity/mild hyperglycemia: -started wt watchers 05/2015 and did really well -unfortunately reports has done poorly with her diet the last few months and regained -reports sugar is the main issue in her diet -wants to start eating healthy again and reports is committed finally to making a lifestyle change for life rather then doing any short term diet -wt 256 (6/16)  --> 241 --> 236 (9/17) --> 245 (11/18)  ROS: See pertinent positives and negatives per HPI.  Past Medical History:  Diagnosis Date  . Allergy   . Asthma   . Blood in stool   . BMI 40.0-44.9, adult (HCC) 12/04/2015  . Depression   . History of chicken pox   . Hypertension   . IBS (irritable bowel syndrome)     History reviewed. No pertinent surgical history.  Family History  Problem Relation Age of Onset  . Hypertension Father   . Breast cancer Mother   . Hyperlipidemia Maternal Grandfather   . Hyperlipidemia Paternal Grandmother   . Hyperlipidemia Paternal Grandfather   . Heart disease Father   . CVA Maternal Grandfather     Social History   Socioeconomic History  . Marital status: Single    Spouse name:  None  . Number of children: None  . Years of education: None  . Highest education level: None  Social Needs  . Financial resource strain: None  . Food insecurity - worry: None  . Food insecurity - inability: None  . Transportation needs - medical: None  . Transportation needs - non-medical: None  Occupational History  . None  Tobacco Use  . Smoking status: Never Smoker  . Smokeless tobacco: Never Used  Substance and Sexual Activity  . Alcohol use: No    Alcohol/week: 0.0 oz  . Drug use: No  . Sexual activity: None  Other Topics Concern  . None  Social History Narrative   Work or School: Paediatric nurse for [employer] Situation: lives alone      Spiritual Beliefs: Christian      Lifestyle: no regular exercise - but starting to work on this and diet        Current Outpatient Medications:  .  albuterol (PROVENTIL HFA;VENTOLIN HFA) 108 (90 Base) MCG/ACT inhaler, Inhale 2 puffs into the lungs every 6 (six) hours as needed., Disp: 1 Inhaler, Rfl: 0 .  cetirizine (ZYRTEC) 10 MG tablet, Take 10 mg by mouth daily., Disp: , Rfl:  .  fluticasone (FLONASE) 50 MCG/ACT nasal spray, Place into both nostrils daily., Disp: , Rfl:  .  hydrochlorothiazide (HYDRODIURIL) 25 MG tablet, TAKE 1 TABLET BY MOUTH EVERY DAY, Disp: 90 tablet, Rfl: 0 .  losartan (COZAAR) 50 MG tablet, TAKE  1 TABLET BY MOUTH EVERY DAY, Disp: 90 tablet, Rfl: 0  EXAM:  Vitals:   01/13/17 0851  BP: 110/76  Pulse: 94  Temp: 98.7 F (37.1 C)    Body mass index is 43.45 kg/m.  GENERAL: vitals reviewed and listed above, alert, oriented, appears well hydrated and in no acute distress  HEENT: atraumatic, conjunttiva clear, no obvious abnormalities on inspection of external nose and ears  NECK: no obvious masses on inspection  LUNGS: clear to auscultation bilaterally, no wheezes, rales or rhonchi, good air movement  CV: HRRR, no peripheral edema  MS: moves all extremities without noticeable  abnormality  PSYCH: pleasant and cooperative, no obvious depression or anxiety  ASSESSMENT AND PLAN:  Discussed the following assessment and plan:  BMI 40.0-44.9, adult (HCC) Hyperglycemia  -discussed a healthy lifestyle at length -advised a healthy low sugar whole foods based diet and discuss easy and affordable options -advised commitment needs to be for life, for a lifestyle change and that short term plans/diets/surgeries and medications don't work with out a lifestyle change -she wants to commit to this and hold off on checking labs for a few monts -wt watcher may help again, but needs to stick with the healthy eating forever  Mild episode of recurrent major depressive disorder (HCC) -discussed tx options -she wants to try CBT - brochure provided and she plans to schedule  Need for immunization against influenza - Plan: Flu Vaccine QUAD 6+ mos PF IM (Fluarix Quad PF)  -Patient advised to return or notify a doctor immediately if symptoms worsen or persist or new concerns arise.  Patient Instructions  BEFORE YOU LEAVE: -follow up: schedule CPE in 2.5 months for labs and pap -flu shot  Call to set up cognitive behavioral therapy and consider apps on phone as well.  Very healthy low sugar diet. Get regular exercise. Back into the Weight Watchers program. You can do this.  Vitamin D3 [phone] international units daily   We recommend the following healthy lifestyle for LIFE: 1) Small portions.   Tip: eat off of a salad plate instead of a dinner plate.  Tip: It is ok to feel hungry after a meal - that likely means you ate an appropriate portion.  Tip: if you need more or a snack choose fruits, veggies and/or a handful of nuts or seeds.  2) Eat a healthy clean diet.   TRY TO EAT: -at least 5-7 servings of low sugar vegetables per day (not corn, potatoes or bananas.) -berries are the best choice if you wish to eat fruit.   -lean meets (fish, chicken or [place]  breasts) -vegan proteins for some meals - beans or tofu, whole grains, nuts and seeds -Replace bad fats with good fats - good fats include: fish, nuts and seeds, canola oil, olive oil -small amounts of low fat or non fat dairy -small amounts of100 % whole grains - check the lables  AVOID: -SUGAR, sweets, anything with added sugar, corn syrup or sweeteners -if you must have a sweetener, small amounts of stevia may be best -sweetened beverages -simple starches (rice, bread, potatoes, pasta, chips, etc - small amounts of 100% whole grains are ok) -red meat, pork, butter -fried foods, fast food, processed food, excessive dairy, eggs and coconut.  3)Get at least 150 minutes of sweaty aerobic exercise per week.  4)Reduce stress - consider counseling, meditation and relaxation to balance other aspects of your life.  WE NOW OFFER   [facility] FAST TRACK!!!  SAME DAY  Appointments for ACUTE CARE  Such as: Sprains, Injuries, cuts, abrasions, rashes, muscle pain, joint pain, back pain Colds, flu, sore throats, headache, allergies, cough, fever  Ear pain, sinus and eye infections Abdominal pain, nausea, vomiting, diarrhea, upset stomach Animal/insect bites  3 Easy Ways to Schedule: Walk-In Scheduling Call in scheduling Mychart Sign-up: [email or web address]        The morning and    Tesha Archambeau R., DO   HPI:  ROS: See pertinent positives and negatives per HPI.  Past Medical History:  Diagnosis Date  . Allergy   . Asthma   . Blood in stool   . BMI 40.0-44.9, adult (HCC) 12/04/2015  . Depression   . History of chicken pox   . Hypertension   . IBS (irritable bowel syndrome)     History reviewed. No pertinent surgical history.  Family History  Problem Relation Age of Onset  . Hypertension Father   . Breast cancer Mother   . Hyperlipidemia Maternal Grandfather   . Hyperlipidemia Paternal Grandmother   . Hyperlipidemia Paternal Grandfather   .  Heart disease Father   . CVA Maternal Grandfather     Social History   Socioeconomic History  . Marital status: Single    Spouse name: None  . Number of children: None  . Years of education: None  . Highest education level: None  Social Needs  . Financial resource strain: None  . Food insecurity - worry: None  . Food insecurity - inability: None  . Transportation needs - medical: None  . Transportation needs - non-medical: None  Occupational History  . None  Tobacco Use  . Smoking status: Never Smoker  . Smokeless tobacco: Never Used  Substance and Sexual Activity  . Alcohol use: No    Alcohol/week: 0.0 oz  . Drug use: No  . Sexual activity: None  Other Topics Concern  . None  Social History Narrative   Work or School: Paediatric nursedevelopment assistant for [employer]      Home Situation: lives alone      Spiritual Beliefs: Christian      Lifestyle: no regular exercise - but starting to work on this and diet        Current Outpatient Medications:  .  albuterol (PROVENTIL HFA;VENTOLIN HFA) 108 (90 Base) MCG/ACT inhaler, Inhale 2 puffs into the lungs every 6 (six) hours as needed., Disp: 1 Inhaler, Rfl: 0 .  cetirizine (ZYRTEC) 10 MG tablet, Take 10 mg by mouth daily., Disp: , Rfl:  .  fluticasone (FLONASE) 50 MCG/ACT nasal spray, Place into both nostrils daily., Disp: , Rfl:  .  hydrochlorothiazide (HYDRODIURIL) 25 MG tablet, TAKE 1 TABLET BY MOUTH EVERY DAY, Disp: 90 tablet, Rfl: 0 .  losartan (COZAAR) 50 MG tablet, TAKE 1 TABLET BY MOUTH EVERY DAY, Disp: 90 tablet, Rfl: 0  EXAM:  Vitals:   01/13/17 0851  BP: 110/76  Pulse: 94  Temp: 98.7 F (37.1 C)    Body mass index is 43.45 kg/m.  GENERAL: vitals reviewed and listed above, alert, oriented, appears well hydrated and in no acute distress  HEENT: atraumatic, conjunttiva clear, no obvious abnormalities on inspection of external nose and ears  NECK: no obvious masses on inspection  LUNGS: clear to auscultation  bilaterally, no wheezes, rales or rhonchi, good air movement  CV: HRRR, no peripheral edema  MS: moves all extremities without noticeable abnormality  PSYCH: pleasant and cooperative, no obvious depression or anxiety  ASSESSMENT AND PLAN:  Discussed the following assessment and  plan:  BMI 40.0-44.9, adult (HCC)  Mild episode of recurrent major depressive disorder (HCC)  Hyperglycemia  Need for immunization against influenza - Plan: Flu Vaccine QUAD 6+ mos PF IM (Fluarix Quad PF)  -Patient advised to return or notify a doctor immediately if symptoms worsen or persist or new concerns arise.  Patient Instructions  BEFORE YOU LEAVE: -follow up: schedule CPE in 2.5 months for labs and pap -flu shot  Call to set up cognitive behavioral therapy and consider apps on phone as well.  Very healthy low sugar diet. Get regular exercise. Back into the Weight Watchers program. You can do this.  Vitamin D3 [phone] international units daily   We recommend the following healthy lifestyle for LIFE: 1) Small portions.   Tip: eat off of a salad plate instead of a dinner plate.  Tip: It is ok to feel hungry after a meal - that likely means you ate an appropriate portion.  Tip: if you need more or a snack choose fruits, veggies and/or a handful of nuts or seeds.  2) Eat a healthy clean diet.   TRY TO EAT: -at least 5-7 servings of low sugar vegetables per day (not corn, potatoes or bananas.) -berries are the best choice if you wish to eat fruit.   -lean meets (fish, chicken or [place] breasts) -vegan proteins for some meals - beans or tofu, whole grains, nuts and seeds -Replace bad fats with good fats - good fats include: fish, nuts and seeds, canola oil, olive oil -small amounts of low fat or non fat dairy -small amounts of100 % whole grains - check the lables  AVOID: -SUGAR, sweets, anything with added sugar, corn syrup or sweeteners -if you must have a sweetener, small amounts of  stevia may be best -sweetened beverages -simple starches (rice, bread, potatoes, pasta, chips, etc - small amounts of 100% whole grains are ok) -red meat, pork, butter -fried foods, fast food, processed food, excessive dairy, eggs and coconut.  3)Get at least 150 minutes of sweaty aerobic exercise per week.  4)Reduce stress - consider counseling, meditation and relaxation to balance other aspects of your life.  WE NOW OFFER   [facility] FAST TRACK!!!  SAME DAY Appointments for ACUTE CARE  Such as: Sprains, Injuries, cuts, abrasions, rashes, muscle pain, joint pain, back pain Colds, flu, sore throats, headache, allergies, cough, fever  Ear pain, sinus and eye infections Abdominal pain, nausea, vomiting, diarrhea, upset stomach Animal/insect bites  3 Easy Ways to Schedule: Walk-In Scheduling Call in scheduling Mychart Sign-up: [email or web address]        The morning and    Kriste Basque R., DO

## 2017-01-13 ENCOUNTER — Ambulatory Visit (INDEPENDENT_AMBULATORY_CARE_PROVIDER_SITE_OTHER): Payer: 59 | Admitting: Family Medicine

## 2017-01-13 ENCOUNTER — Encounter: Payer: Self-pay | Admitting: Family Medicine

## 2017-01-13 VITALS — BP 110/76 | HR 94 | Temp 98.7°F | Ht 63.0 in | Wt 245.3 lb

## 2017-01-13 DIAGNOSIS — R739 Hyperglycemia, unspecified: Secondary | ICD-10-CM

## 2017-01-13 DIAGNOSIS — F330 Major depressive disorder, recurrent, mild: Secondary | ICD-10-CM | POA: Diagnosis not present

## 2017-01-13 DIAGNOSIS — Z6841 Body Mass Index (BMI) 40.0 and over, adult: Secondary | ICD-10-CM

## 2017-01-13 DIAGNOSIS — Z23 Encounter for immunization: Secondary | ICD-10-CM

## 2017-01-13 DIAGNOSIS — F339 Major depressive disorder, recurrent, unspecified: Secondary | ICD-10-CM | POA: Insufficient documentation

## 2017-01-13 NOTE — Patient Instructions (Addendum)
BEFORE YOU LEAVE: -follow up: schedule CPE in 2.5 months for labs and pap -flu shot  Call to set up cognitive behavioral therapy and consider apps on phone as well.  Very healthy low sugar diet. Get regular exercise. Back into the Weight Watchers program. You can do this.  Vitamin D3 [phone] international units daily   We recommend the following healthy lifestyle for LIFE: 1) Small portions.   Tip: eat off of a salad plate instead of a dinner plate.  Tip: It is ok to feel hungry after a meal - that likely means you ate an appropriate portion.  Tip: if you need more or a snack choose fruits, veggies and/or a handful of nuts or seeds.  2) Eat a healthy clean diet.   TRY TO EAT: -at least 5-7 servings of low sugar vegetables per day (not corn, potatoes or bananas.) -berries are the best choice if you wish to eat fruit.   -lean meets (fish, chicken or [place] breasts) -vegan proteins for some meals - beans or tofu, whole grains, nuts and seeds -Replace bad fats with good fats - good fats include: fish, nuts and seeds, canola oil, olive oil -small amounts of low fat or non fat dairy -small amounts of100 % whole grains - check the lables  AVOID: -SUGAR, sweets, anything with added sugar, corn syrup or sweeteners -if you must have a sweetener, small amounts of stevia may be best -sweetened beverages -simple starches (rice, bread, potatoes, pasta, chips, etc - small amounts of 100% whole grains are ok) -red meat, pork, butter -fried foods, fast food, processed food, excessive dairy, eggs and coconut.  3)Get at least 150 minutes of sweaty aerobic exercise per week.  4)Reduce stress - consider counseling, meditation and relaxation to balance other aspects of your life.  WE NOW OFFER   [facility] FAST TRACK!!!  SAME DAY Appointments for ACUTE CARE  Such as: Sprains, Injuries, cuts, abrasions, rashes, muscle pain, joint pain, back pain Colds, flu, sore throats, headache,  allergies, cough, fever  Ear pain, sinus and eye infections Abdominal pain, nausea, vomiting, diarrhea, upset stomach Animal/insect bites  3 Easy Ways to Schedule: Walk-In Scheduling Call in scheduling Mychart Sign-up: [email or web address]        The morning and

## 2017-03-06 DIAGNOSIS — J40 Bronchitis, not specified as acute or chronic: Secondary | ICD-10-CM | POA: Diagnosis not present

## 2017-03-06 DIAGNOSIS — J329 Chronic sinusitis, unspecified: Secondary | ICD-10-CM | POA: Diagnosis not present

## 2017-03-19 ENCOUNTER — Other Ambulatory Visit: Payer: Self-pay | Admitting: Family Medicine

## 2017-03-21 ENCOUNTER — Ambulatory Visit (INDEPENDENT_AMBULATORY_CARE_PROVIDER_SITE_OTHER): Payer: 59 | Admitting: Family Medicine

## 2017-03-21 ENCOUNTER — Encounter: Payer: Self-pay | Admitting: Family Medicine

## 2017-03-21 VITALS — BP 118/90 | HR 80 | Temp 98.8°F | Ht 63.0 in | Wt 251.4 lb

## 2017-03-21 DIAGNOSIS — J310 Chronic rhinitis: Secondary | ICD-10-CM

## 2017-03-21 DIAGNOSIS — J329 Chronic sinusitis, unspecified: Secondary | ICD-10-CM | POA: Diagnosis not present

## 2017-03-21 NOTE — Progress Notes (Signed)
HPI:  Acute visit for respiratory illness: -started: Over the last several days -symptoms:nasal congestion, sore throat, cough, left maxillary sinus pain -denies:fever, SOB, NVD, tooth pain, body aches -has tried: Essential oils and Flonase, she prefers to do this as opposed to taking medications -sick contacts/travel/risks: no reported flu, strep or tick exposure -Hx of: Reports was seen in urgent care last month for sinus infection and treated with antibiotic twice, amoxicillin and then doxycycline ROS: See pertinent positives and negatives per HPI.  Past Medical History:  Diagnosis Date  . Allergy   . Asthma   . Blood in stool   . BMI 40.0-44.9, adult (HCC) 12/04/2015  . Depression   . History of chicken pox   . Hypertension   . IBS (irritable bowel syndrome)     History reviewed. No pertinent surgical history.  Family History  Problem Relation Age of Onset  . Hypertension Father   . Breast cancer Mother   . Hyperlipidemia Maternal Grandfather   . Hyperlipidemia Paternal Grandmother   . Hyperlipidemia Paternal Grandfather   . Heart disease Father   . CVA Maternal Grandfather     Social History   Socioeconomic History  . Marital status: Single    Spouse name: None  . Number of children: None  . Years of education: None  . Highest education level: None  Social Needs  . Financial resource strain: None  . Food insecurity - worry: None  . Food insecurity - inability: None  . Transportation needs - medical: None  . Transportation needs - non-medical: None  Occupational History  . None  Tobacco Use  . Smoking status: Never Smoker  . Smokeless tobacco: Never Used  Substance and Sexual Activity  . Alcohol use: No    Alcohol/week: 0.0 oz  . Drug use: No  . Sexual activity: None  Other Topics Concern  . None  Social History Narrative   Work or School: Paediatric nursedevelopment assistant for [employer]      Home Situation: lives alone      Spiritual Beliefs: Christian      Lifestyle: no regular exercise - but starting to work on this and diet        Current Outpatient Medications:  .  albuterol (PROVENTIL HFA;VENTOLIN HFA) 108 (90 Base) MCG/ACT inhaler, Inhale 2 puffs into the lungs every 6 (six) hours as needed., Disp: 1 Inhaler, Rfl: 0 .  cetirizine (ZYRTEC) 10 MG tablet, Take 10 mg by mouth daily., Disp: , Rfl:  .  fluticasone (FLONASE) 50 MCG/ACT nasal spray, Place into both nostrils daily., Disp: , Rfl:  .  hydrochlorothiazide (HYDRODIURIL) 25 MG tablet, TAKE 1 TABLET BY MOUTH EVERY DAY, Disp: 90 tablet, Rfl: 1 .  losartan (COZAAR) 50 MG tablet, TAKE 1 TABLET BY MOUTH EVERY DAY, Disp: 90 tablet, Rfl: 1  EXAM:  Vitals:   03/21/17 1530  BP: 118/90  Pulse: 80  Temp: 98.8 F (37.1 C)    Body mass index is 44.53 kg/m.  GENERAL: vitals reviewed and listed above, alert, oriented, appears well hydrated and in no acute distress  HEENT: atraumatic, conjunttiva clear, no obvious abnormalities on inspection of external nose and ears, normal appearance of ear canals and TMs, clear nasal congestion, mild post oropharyngeal erythema with PND, no tonsillar edema or exudate, no sinus TTP  NECK: no obvious masses on inspection  LUNGS: clear to auscultation bilaterally, no wheezes, rales or rhonchi, good air movement,  CV: HRRR, no peripheral edema  MS: moves all extremities without  noticeable abnormality  PSYCH: pleasant and cooperative, no obvious depression or anxiety  ASSESSMENT AND PLAN:  Discussed the following assessment and plan:  Rhinosinusitis  -given HPI and exam findings today, a serious infection or illness is unlikely. We discussed potential etiologies, with VURI being most likely, and advised supportive care and monitoring. We discussed treatment side effects, likely course, antibiotic misuse, transmission, and signs of developing a serious illness. -Given she has had some recurrent issues, I did advise that she see an ear nose and throat  specialist if this does not resolve or is worsening over the next 3-4 days, number provided to call -of course, we advised to return or notify a doctor immediately if symptoms worsen or persist or new concerns arise.    Patient Instructions  Continue flonase  Humidifier at night - clean properly  Afrin nasal spray for 4 days then stop - do not use longer the 4 days  See Ear, nose and throat doctor if not improving   [facility], Dahlia Client R., DO

## 2017-03-21 NOTE — Patient Instructions (Signed)
Continue flonase  Humidifier at night - clean properly  Afrin nasal spray for 4 days then stop - do not use longer the 4 days  See Ear, nose and throat doctor if not improving

## 2017-03-26 DIAGNOSIS — J329 Chronic sinusitis, unspecified: Secondary | ICD-10-CM | POA: Diagnosis not present

## 2017-03-26 DIAGNOSIS — J320 Chronic maxillary sinusitis: Secondary | ICD-10-CM | POA: Diagnosis not present

## 2017-03-26 DIAGNOSIS — R51 Headache: Secondary | ICD-10-CM | POA: Diagnosis not present

## 2017-03-27 ENCOUNTER — Encounter: Payer: 59 | Admitting: Family Medicine

## 2017-05-15 ENCOUNTER — Encounter: Payer: 59 | Admitting: Family Medicine

## 2017-08-27 NOTE — Progress Notes (Signed)
HPI:  Using dictation device. Unfortunately this device frequently misinterprets words/phrases.  Kristy Ross is a pleasant 27 y.o. here for follow up. This was supposed to be a physical, but she prefers to reschedule as is on her period and was due for f/u. Chronic medical problems summarized below were reviewed for changes and stability and were updated as needed below. These issues and their treatment remain stable for the most part. * She has been improving her diet - did Whole 30 and has found what foods work for her and what foods do not. Is happy she is losing weight. She also is trying to exercise more. Reports she feels well and mood has improved. Denies CP, SOB, DOE, treatment intolerance or new symptoms.  Depression/ANxiety: -chronic on and off since age 77, better recently -on zoloft and prozac in the past -would prefer to treat without medications -no SI, hallucinations, thoughts of self harm or manic symptoms  HTN: -meds:hctz (stopped lisinopril 6/17 due to asthma), losartan 49 -long hx of HTN and strong FH; on BP meds since 8th grade -reports feels much better on losartan -denies: CP, palpitations, swelling, HA, vision changes  Allergies/Dyspnea/Wheezing: -informal dx mild intermittent asthma when came to me -meds: alb, zyrtec, flonase -PFT 03/2014 with minimal obstruction -brother has asthma -denies: SOB, DOE, CP today  Obesity/mild hyperglycemia: -started wt watchers3/2017 and did really well, then regained -sugar was a main issue in her diet -now working on whole foods diet, getting some exercise, feeling good -wt 256 (6/16) -->241 -->236 (9/17) --> 245 (11/18) --> 239 (6/19)   ROS: See pertinent positives and negatives per HPI.  Past Medical History:  Diagnosis Date  . Allergy   . Asthma   . Blood in stool   . BMI 40.0-44.9, adult (HCC) 12/04/2015  . Depression   . History of chicken pox   . Hypertension   . IBS (irritable bowel syndrome)      History reviewed. No pertinent surgical history.  Family History  Problem Relation Age of Onset  . Hypertension Father   . Breast cancer Mother   . Hyperlipidemia Maternal Grandfather   . Hyperlipidemia Paternal Grandmother   . Hyperlipidemia Paternal Grandfather   . Heart disease Father   . CVA Maternal Grandfather     SOCIAL HX: see hpi   Current Outpatient Medications:  .  albuterol (PROVENTIL HFA;VENTOLIN HFA) 108 (90 Base) MCG/ACT inhaler, Inhale 2 puffs into the lungs every 6 (six) hours as needed., Disp: 1 Inhaler, Rfl: 0 .  cetirizine (ZYRTEC) 10 MG tablet, Take 10 mg by mouth daily., Disp: , Rfl:  .  fluticasone (FLONASE) 50 MCG/ACT nasal spray, Place into both nostrils daily., Disp: , Rfl:  .  hydrochlorothiazide (HYDRODIURIL) 25 MG tablet, TAKE 1 TABLET BY MOUTH EVERY DAY, Disp: 90 tablet, Rfl: 1 .  losartan (COZAAR) 50 MG tablet, TAKE 1 TABLET BY MOUTH EVERY DAY, Disp: 90 tablet, Rfl: 1  EXAM:  Vitals:   08/28/17 0934  BP: 124/76  Pulse: (!) 106  Temp: 97.9 F (36.6 C)  SpO2: 96%    Body mass index is 43.47 kg/m.  GENERAL: vitals reviewed and listed above, alert, oriented, appears well hydrated and in no acute distress  HEENT: atraumatic, conjunttiva clear, no obvious abnormalities on inspection of external nose and ears  NECK: no obvious masses on inspection  LUNGS: clear to auscultation bilaterally, no wheezes, rales or rhonchi, good air movement  CV: HRRR, no peripheral edema  MS: moves all extremities without  noticeable abnormality  PSYCH: pleasant and cooperative, no obvious depression or anxiety  ASSESSMENT AND PLAN:  Discussed the following assessment and plan:  Essential hypertension - Plan: Basic metabolic panel, CBC  BMI 40.0-44.9, adult (HCC) - Plan: Lipid panel, Hemoglobin A1c  Mild intermittent asthma with acute exacerbation  -labs per orders -spent quite a bit of time talking about a helathy diet - encouraged a healthy low  sugar, whole foods based diet for life along with regular aerobic exercise. Congratulated on changes and wt reduction. -CPE in 3 months  Patient Instructions  BEFORE YOU LEAVE: -labs -follow up: CPE w/ pap in 3 months (did not do CPE today)  We have ordered labs or studies at this visit. It can take up to 1-2 weeks for results and processing. IF results require follow up or explanation, we will call you with instructions. Clinically stable results will be released to your [facility]. If you have not heard from us or cannot find your results in [facility] in 2 weeks please contact our office at [phone].  If you are not yet signed up for [facility], please consider signing up.   We recommend the following healthy lifestyle for LIFE: 1) Small portions. But, make sure to get regular (at least 3 per day), healthy meals and small healthy snacks if needed.  2) Eat a healthy clean diet.   TRY TO EAT: -at least 5-7 servings of low sugar, colorful, and nutrient rich vegetables per day (not corn, potatoes or bananas.) -berries are the best choice if you wish to eat fruit (only eat small amounts if trying to reduce weight)  -lean meets (fish, white meat of chicken or [place]) -vegan proteins for some meals - beans or tofu, whole grains, nuts and seeds -Replace bad fats with good fats - good fats include: fish, nuts and seeds, canola oil, olive oil -small amounts of low fat or non fat dairy -small amounts of100 % whole grains - check the lables -drink plenty of water  AVOID: -SUGAR, sweets, anything with added sugar, corn syrup or sweeteners - must read labels as even foods advertised as "healthy" often are loaded with sugar -if you must have a sweetener, small amounts of stevia may be best -sweetened beverages and artificially sweetened beverages -simple starches (rice, bread, potatoes, pasta, chips, etc - small amounts of 100% whole grains are ok) -red meat, pork, butter -fried foods, fast food,  processed food, excessive dairy, eggs and coconut.  3)Get at least 150 minutes of sweaty aerobic exercise per week.  4)Reduce stress - consider counseling, meditation and relaxation to balance other aspects of your life.          Terressa KoyanagiHannah R Patriciann Becht, DO

## 2017-08-28 ENCOUNTER — Ambulatory Visit (INDEPENDENT_AMBULATORY_CARE_PROVIDER_SITE_OTHER): Payer: 59 | Admitting: Family Medicine

## 2017-08-28 ENCOUNTER — Encounter: Payer: Self-pay | Admitting: Family Medicine

## 2017-08-28 VITALS — BP 124/76 | HR 106 | Temp 97.9°F | Ht 62.25 in | Wt 239.6 lb

## 2017-08-28 DIAGNOSIS — I10 Essential (primary) hypertension: Secondary | ICD-10-CM

## 2017-08-28 DIAGNOSIS — Z6841 Body Mass Index (BMI) 40.0 and over, adult: Secondary | ICD-10-CM | POA: Diagnosis not present

## 2017-08-28 DIAGNOSIS — E669 Obesity, unspecified: Secondary | ICD-10-CM | POA: Diagnosis not present

## 2017-08-28 DIAGNOSIS — J4521 Mild intermittent asthma with (acute) exacerbation: Secondary | ICD-10-CM

## 2017-08-28 LAB — CBC
HCT: 42.9 % (ref 36.0–46.0)
Hemoglobin: 14.7 g/dL (ref 12.0–15.0)
MCHC: 34.3 g/dL (ref 30.0–36.0)
MCV: 91.1 fl (ref 78.0–100.0)
Platelets: 373 10*3/uL (ref 150.0–400.0)
RBC: 4.71 Mil/uL (ref 3.87–5.11)
RDW: 13.2 % (ref 11.5–15.5)
WBC: 7.7 10*3/uL (ref 4.0–10.5)

## 2017-08-28 LAB — LIPID PANEL
CHOLESTEROL: 166 mg/dL (ref 0–200)
HDL: 43 mg/dL (ref >39.00)
LDL CALC: 105 mg/dL — AB (ref 0–99)
NonHDL: 123.34
TRIGLYCERIDES: 93 mg/dL (ref 0.0–149.0)
Total CHOL/HDL Ratio: 4
VLDL: 18.6 mg/dL (ref 0.0–40.0)

## 2017-08-28 LAB — BASIC METABOLIC PANEL
BUN: 13 mg/dL (ref 6–23)
CALCIUM: 9.6 mg/dL (ref 8.4–10.5)
CHLORIDE: 99 meq/L (ref 96–112)
CO2: 28 mEq/L (ref 19–32)
CREATININE: 0.81 mg/dL (ref 0.40–1.20)
GFR: 89.28 mL/min (ref >60.00)
Glucose, Bld: 92 mg/dL (ref 70–99)
Potassium: 3.5 mEq/L (ref 3.5–5.1)
Sodium: 136 mEq/L (ref 135–145)

## 2017-08-28 LAB — HEMOGLOBIN A1C: Hgb A1c MFr Bld: 5.4 % (ref 4.6–6.5)

## 2017-08-28 NOTE — Patient Instructions (Signed)
BEFORE YOU LEAVE: -labs -follow up: CPE w/ pap in 3 months (did not do CPE today)  We have ordered labs or studies at this visit. It can take up to 1-2 weeks for results and processing. IF results require follow up or explanation, we will call you with instructions. Clinically stable results will be released to your [facility]. If you have not heard from us or cannot find your results in [facility] in 2 weeks please contact our office at [phone].  If you are not yet signed up for [facility], please consider signing up.   We recommend the following healthy lifestyle for LIFE: 1) Small portions. But, make sure to get regular (at least 3 per day), healthy meals and small healthy snacks if needed.  2) Eat a healthy clean diet.   TRY TO EAT: -at least 5-7 servings of low sugar, colorful, and nutrient rich vegetables per day (not corn, potatoes or bananas.) -berries are the best choice if you wish to eat fruit (only eat small amounts if trying to reduce weight)  -lean meets (fish, white meat of chicken or [place]) -vegan proteins for some meals - beans or tofu, whole grains, nuts and seeds -Replace bad fats with good fats - good fats include: fish, nuts and seeds, canola oil, olive oil -small amounts of low fat or non fat dairy -small amounts of100 % whole grains - check the lables -drink plenty of water  AVOID: -SUGAR, sweets, anything with added sugar, corn syrup or sweeteners - must read labels as even foods advertised as "healthy" often are loaded with sugar -if you must have a sweetener, small amounts of stevia may be best -sweetened beverages and artificially sweetened beverages -simple starches (rice, bread, potatoes, pasta, chips, etc - small amounts of 100% whole grains are ok) -red meat, pork, butter -fried foods, fast food, processed food, excessive dairy, eggs and coconut.  3)Get at least 150 minutes of sweaty aerobic exercise per week.  4)Reduce stress - consider counseling,  meditation and relaxation to balance other aspects of your life.

## 2017-10-02 ENCOUNTER — Other Ambulatory Visit: Payer: Self-pay | Admitting: Family Medicine

## 2017-11-25 NOTE — Progress Notes (Signed)
HPI:  Using dictation device. Unfortunately this device frequently misinterprets words/phrases.  Here for CPE:  -Concerns and/or follow up today:  Due for pap and flu shot.   Chronic medical problems summarized below were reviewed for changes.  Reports she is doing well for the most part.  Her PHQ 9 is 6, however she denies depression.  Reports she has had some increased stress.  Prefers to treat this without medication.  She will be seeing a counselor starting today.  She is excited about this.  Continues to struggle with eating a healthy diet.  Feels like she eats worse when she gets stressed out.  She feels that counseling will help with this.  Depression/ANxiety: -chronic on and off since age 14, better recently -on zoloft and prozac in the past -would prefer to treat without medications -no SI, hallucinations, thoughts of self harm or manic symptoms  HTN: -meds:hctz (stopped lisinopril 6/17 due to asthma), losartan 49 -long hx of HTN and strong FH; on BP meds since 8th grade -reports feels much better on losartan -denies: CP, palpitations, swelling, HA, vision changes  Allergies/Dyspnea/Wheezing: -informal dx mild intermittent asthma when came to me -meds: alb, zyrtec, flonase -PFT 1/2016with minimal obstruction -brother has asthma -denies: SOB, DOE, CP today  Obesity/mild hyperglycemia/Mild hyperlipidemia: -started wt watchers3/2017and did really well, then regained -sugar was a main issue in her diet -now working on whole foods diet, getting some exercise, feeling good -wt 256 (6/16) -->241 -->236(9/17)--> 245 (11/18) --> 239 (6/19) --> 246   -Diet: variety of foods, balance and well rounded, larger portion sizes -intermittently better diet -Exercise: Some exercise, no symptoms with exercise -Taking folic acid, vitamin D or calcium: no -Diabetes and Dyslipidemia Screening: Screening done -Vaccines: see vaccine section EPIC -pap history: Reports had a  normal Pap about 4 to 5 years ago, denies any history of abnormal Pap smears -FDLMP: see nursing notes -sexual activity: Has only had one partner in -wants STI testing (Hep C if born 1946-65): no -FH breast, colon or ovarian ca: see FH Last mammogram: Not applicable, she does have a family history of breast cancer in her mother, however she reports her mother had genetic testing which was negative no genetic testing was recommended for her children or family members Last colon cancer screening: Not applicable Breast Ca Risk Assessment: see family history and pt history DEXA (>/= 65): Not applicable  -Alcohol, Tobacco, drug use: see social history  Review of Systems - no fevers, unintentional weight loss, vision loss, hearing loss, chest pain, sob, hemoptysis, melena, hematochezia, hematuria, genital discharge, changing or concerning skin lesions, bleeding, bruising, loc, thoughts of self harm or SI  Past Medical History:  Diagnosis Date  . Allergy   . Asthma   . Blood in stool   . BMI 40.0-44.9, adult (HCC) 12/04/2015  . Depression   . History of chicken pox   . Hypertension   . IBS (irritable bowel syndrome)     History reviewed. No pertinent surgical history.  Family History  Problem Relation Age of Onset  . Hypertension Father   . Breast cancer Mother   . Hyperlipidemia Maternal Grandfather   . Hyperlipidemia Paternal Grandmother   . Hyperlipidemia Paternal Grandfather   . Heart disease Father   . CVA Maternal Grandfather     Social History   Socioeconomic History  . Marital status: Single    Spouse name: Not on file  . Number of children: Not on file  . Years of education: Not   on file  . Highest education level: Not on file  Occupational History  . Not on file  Social Needs  . Financial resource strain: Not on file  . Food insecurity:    Worry: Not on file    Inability: Not on file  . Transportation needs:    Medical: Not on file    Non-medical: Not on file    Tobacco Use  . Smoking status: Never Smoker  . Smokeless tobacco: Never Used  Substance and Sexual Activity  . Alcohol use: No    Alcohol/week: 0.0 standard drinks  . Drug use: No  . Sexual activity: Not on file  Lifestyle  . Physical activity:    Days per week: Not on file    Minutes per session: Not on file  . Stress: Not on file  Relationships  . Social connections:    Talks on phone: Not on file    Gets together: Not on file    Attends religious service: Not on file    Active member of club or organization: Not on file    Attends meetings of clubs or organizations: Not on file    Relationship status: Not on file  Other Topics Concern  . 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  albuterol (PROVENTIL HFA;VENTOLIN HFA) 108 (90 Base) MCG/ACT inhaler, Inhale 2 puffs into the lungs every 6 (six) hours as needed., Disp: 1 Inhaler, Rfl: 0 .  cetirizine (ZYRTEC) 10 MG tablet, Take 10 mg by mouth daily., Disp: , Rfl:  .  fluticasone (FLONASE) 50 MCG/ACT nasal spray, Place into both nostrils daily., Disp: , Rfl:  .  hydrochlorothiazide (HYDRODIURIL) 25 MG tablet, TAKE 1 TABLET BY MOUTH EVERY DAY, Disp: 30 tablet, Rfl: 5 .  losartan (COZAAR) 50 MG tablet, TAKE 1 TABLET BY MOUTH EVERY DAY, Disp: 30 tablet, Rfl: 5  EXAM:  Vitals:   11/27/17 0828  BP: 110/80  Pulse: (!) 123  Temp: 98.8 F (37.1 C)    GENERAL: vitals reviewed and listed below, alert, oriented, appears well hydrated and in no acute distress  HEENT: head atraumatic, PERRLA, normal appearance of eyes, ears, nose and mouth. moist mucus membranes.  NECK: supple, no masses or lymphadenopathy  LUNGS: clear to auscultation bilaterally, no rales, rhonchi or wheeze  CV: HRRR, no  peripheral edema or cyanosis, normal pedal pulses  ABDOMEN: bowel sounds normal, soft, non tender to palpation, no masses, no rebound or guarding  BREAST: normal appearance - no skin lesions or discharge noted on inspection of both breasts, on palpation of both breast and axillary region no suspicious lesions appreciated today  GU: normal appearance of external genitalia - no lesions or masses appreciated, normal appearing vaginal mucosa - no abnormal discharge, normal appearance of cervix except for a small cervical polyp- no abnormal discharge observed.  Pap obtained  RECTAL: deferred  SKIN: no rash or abnormal lesions  MS: normal gait, moves all extremities normally  NEURO: normal gait, speech and thought processing grossly intact, muscle tone grossly intact throughout  PSYCH: normal affect, pleasant and cooperative  ASSESSMENT AND PLAN:  Discussed the following assessment and plan:  PREVENTIVE EXAM: -Discussed and advised all   [place] preventive services health task force level A and B recommendations for age, sex and risks. -Advised at least 150 minutes of exercise per week and a healthy diet with avoidance of (less then 1 serving per week) processed foods, white starches, red meat, fast foods and sweets and consisting of: * 5-9 servings of fresh fruits and vegetables (not corn or potatoes) *nuts and seeds, beans *olives and olive oil *lean meats such as fish and white chicken  *whole grains -labs, studies and vaccines per orders this encounter  Cervical polyp -Recommended seeing a gynecologist about this to recheck in the next 1 year, likely benign  Stress -she is seeing a counselor, recommended follow-up if any worsening or other concerns or not improving with treatment  Cervical cancer screening - Plan: PAP [[place]]   Obesity/Hypertension/mild hyperlipidemia: -Healthy lifestyle with a healthy low sugar whole foods based diet and regular aerobic exercise recommended,  there are other options for weight reduction as well.  Her blood pressure is a little elevated today, but she is very anxious about getting a Pap smear.  Recommended follow-up in 3 to 4 months to recheck.  Patient advised to return to clinic immediately if symptoms worsen or persist or new concerns.  Patient Instructions  BEFORE YOU LEAVE: -flu shot -follow up: 3-4 months  I am glad you will be seeing the counselor. Please follow up here if any worsening or you need further assistance with the stress.  Please see a gynecologist sometime in the next 6-12 months to recheck the polyp on the cervix.   We recommend the following healthy lifestyle for LIFE: 1) Small portions. But, make sure to get regular (at least 3 per day), healthy meals and small healthy snacks if needed.  2) Eat a healthy clean diet.   TRY TO EAT: -at least 5-7 servings of low sugar, colorful, and nutrient rich vegetables per day (not corn, potatoes or bananas.) -berries are the best choice if you wish to eat fruit (only eat small amounts if trying to reduce weight)  -lean meets (fish, white meat of chicken or [place]) -vegan proteins for some meals - beans or tofu, whole grains, nuts and seeds -Replace bad fats with good fats - good fats include: fish, nuts and seeds, canola oil, olive oil -small amounts of low fat or non fat dairy -small amounts of100 % whole grains - check the lables -drink plenty of water  AVOID: -SUGAR, sweets, anything with added sugar, corn syrup or sweeteners - must read labels as even foods advertised as "healthy" often are loaded with sugar -if you must have a sweetener, small amounts of stevia may be best -sweetened beverages and artificially sweetened beverages -simple starches (rice, bread, potatoes, pasta, chips, etc - small amounts of 100% whole grains are ok) -red meat, pork, butter -fried foods, fast food, processed food, excessive dairy, eggs and coconut.  3)Get at least 150  minutes of sweaty aerobic exercise per week.  4)Reduce stress - consider counseling, meditation and relaxation to balance other aspects of your life.   Preventive Care 18-39 Years, Female Preventive care refers to lifestyle choices and visits with your health care provider that can promote health and wellness. What does preventive care include?  A yearly physical exam. This is also called an annual well check.  Dental exams once or twice a year.  Routine eye exams. Ask your health care provider how often you should have your eyes checked.  Personal lifestyle choices, including: ? Daily care  of your teeth and gums. ? Regular physical activity. ? Eating a healthy diet. ? Avoiding tobacco and drug use. ? Limiting alcohol use. ? Practicing safe sex. ? Taking vitamin and mineral supplements as recommended by your health care provider. What happens during an annual well check? The services and screenings done by your health care provider during your annual well check will depend on your age, overall health, lifestyle risk factors, and family history of disease. Counseling Your health care provider may ask you questions about your:  Alcohol use.  Tobacco use.  Drug use.  Emotional well-being.  Home and relationship well-being.  Sexual activity.  Eating habits.  Work and work Statistician.  Method of birth control.  Menstrual cycle.  Pregnancy history.  Screening You may have the following tests or measurements:  Height, weight, and BMI.  Diabetes screening. This is done by checking your blood sugar (glucose) after you have not eaten for a while (fasting).  Blood pressure.  Lipid and cholesterol levels. These may be checked every 5 years starting at age 47.  Skin check.  Hepatitis C blood test.  Hepatitis B blood test.  Sexually transmitted disease (STD) testing.  BRCA-related cancer screening. This may be done if you have a family history of breast,  ovarian, tubal, or peritoneal cancers.  Pelvic exam and Pap test. This may be done every 3 years starting at age 53. Starting at age 86, this may be done every 5 years if you have a Pap test in combination with an HPV test.  Discuss your test results, treatment options, and if necessary, the need for more tests with your health care provider. Vaccines Your health care provider may recommend certain vaccines, such as:  Influenza vaccine. This is recommended every year.  Tetanus, diphtheria, and acellular pertussis (Tdap, Td) vaccine. You may need a Td booster every 10 years.  Varicella vaccine. You may need this if you have not been vaccinated.  HPV vaccine. If you are 77 or younger, you may need three doses over 6 months.  Measles, mumps, and rubella (MMR) vaccine. You may need at least one dose of MMR. You may also need a second dose.  Pneumococcal 13-valent conjugate (PCV13) vaccine. You may need this if you have certain conditions and were not previously vaccinated.  Pneumococcal polysaccharide (PPSV23) vaccine. You may need one or two doses if you smoke cigarettes or if you have certain conditions.  Meningococcal vaccine. One dose is recommended if you are age 25-21 years and a first-year college student living in a residence hall, or if you have one of several medical conditions. You may also need additional booster doses.  Hepatitis A vaccine. You may need this if you have certain conditions or if you travel or work in places where you may be exposed to hepatitis A.  Hepatitis B vaccine. You may need this if you have certain conditions or if you travel or work in places where you may be exposed to hepatitis B.  Haemophilus influenzae type b (Hib) vaccine. You may need this if you have certain risk factors.  Talk to your health care provider about which screenings and vaccines you need and how often you need them. This information is not intended to replace advice given to you by  your health care provider. Make sure you discuss any questions you have with your health care provider. Document Released: 04/23/2001 Document Revised: 11/15/2015 Document Reviewed: 12/27/2014 Elsevier Interactive Patient Education  [employer].  No follow-ups on file.   R , DO   

## 2017-11-27 ENCOUNTER — Ambulatory Visit (INDEPENDENT_AMBULATORY_CARE_PROVIDER_SITE_OTHER): Payer: 59 | Admitting: Family Medicine

## 2017-11-27 ENCOUNTER — Encounter: Payer: Self-pay | Admitting: Family Medicine

## 2017-11-27 ENCOUNTER — Other Ambulatory Visit (HOSPITAL_COMMUNITY)
Admission: RE | Admit: 2017-11-27 | Discharge: 2017-11-27 | Disposition: A | Discharge status: Home / Self Care | Payer: 59 | Source: Ambulatory Visit | Attending: Internal Medicine | Admitting: Internal Medicine

## 2017-11-27 VITALS — BP 110/80 | HR 123 | Temp 98.8°F | Ht 64.0 in | Wt 246.8 lb

## 2017-11-27 DIAGNOSIS — Z23 Encounter for immunization: Secondary | ICD-10-CM | POA: Diagnosis not present

## 2017-11-27 DIAGNOSIS — Z124 Encounter for screening for malignant neoplasm of cervix: Secondary | ICD-10-CM

## 2017-11-27 DIAGNOSIS — F439 Reaction to severe stress, unspecified: Secondary | ICD-10-CM | POA: Diagnosis not present

## 2017-11-27 DIAGNOSIS — N841 Polyp of cervix uteri: Secondary | ICD-10-CM | POA: Insufficient documentation

## 2017-11-27 DIAGNOSIS — Z0000 Encounter for general adult medical examination without abnormal findings: Secondary | ICD-10-CM | POA: Diagnosis not present

## 2017-11-27 NOTE — Patient Instructions (Signed)
BEFORE YOU LEAVE: -flu shot -follow up: 3-4 months  I am glad you will be seeing the counselor. Please follow up here if any worsening or you need further assistance with the stress.  Please see a gynecologist sometime in the next 6-12 months to recheck the polyp on the cervix.   We recommend the following healthy lifestyle for LIFE: 1) Small portions. But, make sure to get regular (at least 3 per day), healthy meals and small healthy snacks if needed.  2) Eat a healthy clean diet.   TRY TO EAT: -at least 5-7 servings of low sugar, colorful, and nutrient rich vegetables per day (not corn, potatoes or bananas.) -berries are the best choice if you wish to eat fruit (only eat small amounts if trying to reduce weight)  -lean meets (fish, white meat of chicken or [place]) -vegan proteins for some meals - beans or tofu, whole grains, nuts and seeds -Replace bad fats with good fats - good fats include: fish, nuts and seeds, canola oil, olive oil -small amounts of low fat or non fat dairy -small amounts of100 % whole grains - check the lables -drink plenty of water  AVOID: -SUGAR, sweets, anything with added sugar, corn syrup or sweeteners - must read labels as even foods advertised as "healthy" often are loaded with sugar -if you must have a sweetener, small amounts of stevia may be best -sweetened beverages and artificially sweetened beverages -simple starches (rice, bread, potatoes, pasta, chips, etc - small amounts of 100% whole grains are ok) -red meat, pork, butter -fried foods, fast food, processed food, excessive dairy, eggs and coconut.  3)Get at least 150 minutes of sweaty aerobic exercise per week.  4)Reduce stress - consider counseling, meditation and relaxation to balance other aspects of your life.   Preventive Care 18-39 Years, Female Preventive care refers to lifestyle choices and visits with your health care provider that can promote health and wellness. What does  preventive care include?  A yearly physical exam. This is also called an annual well check.  Dental exams once or twice a year.  Routine eye exams. Ask your health care provider how often you should have your eyes checked.  Personal lifestyle choices, including: ? Daily care of your teeth and gums. ? Regular physical activity. ? Eating a healthy diet. ? Avoiding tobacco and drug use. ? Limiting alcohol use. ? Practicing safe sex. ? Taking vitamin and mineral supplements as recommended by your health care provider. What happens during an annual well check? The services and screenings done by your health care provider during your annual well check will depend on your age, overall health, lifestyle risk factors, and family history of disease. Counseling Your health care provider may ask you questions about your:  Alcohol use.  Tobacco use.  Drug use.  Emotional well-being.  Home and relationship well-being.  Sexual activity.  Eating habits.  Work and work Statistician.  Method of birth control.  Menstrual cycle.  Pregnancy history.  Screening You may have the following tests or measurements:  Height, weight, and BMI.  Diabetes screening. This is done by checking your blood sugar (glucose) after you have not eaten for a while (fasting).  Blood pressure.  Lipid and cholesterol levels. These may be checked every 5 years starting at age 87.  Skin check.  Hepatitis C blood test.  Hepatitis B blood test.  Sexually transmitted disease (STD) testing.  BRCA-related cancer screening. This may be done if you have a family history  of breast, ovarian, tubal, or peritoneal cancers.  Pelvic exam and Pap test. This may be done every 3 years starting at age 99. Starting at age 41, this may be done every 5 years if you have a Pap test in combination with an HPV test.  Discuss your test results, treatment options, and if necessary, the need for more tests with your health  care provider. Vaccines Your health care provider may recommend certain vaccines, such as:  Influenza vaccine. This is recommended every year.  Tetanus, diphtheria, and acellular pertussis (Tdap, Td) vaccine. You may need a Td booster every 10 years.  Varicella vaccine. You may need this if you have not been vaccinated.  HPV vaccine. If you are 6 or younger, you may need three doses over 6 months.  Measles, mumps, and rubella (MMR) vaccine. You may need at least one dose of MMR. You may also need a second dose.  Pneumococcal 13-valent conjugate (PCV13) vaccine. You may need this if you have certain conditions and were not previously vaccinated.  Pneumococcal polysaccharide (PPSV23) vaccine. You may need one or two doses if you smoke cigarettes or if you have certain conditions.  Meningococcal vaccine. One dose is recommended if you are age 70-21 years and a first-year college student living in a residence hall, or if you have one of several medical conditions. You may also need additional booster doses.  Hepatitis A vaccine. You may need this if you have certain conditions or if you travel or work in places where you may be exposed to hepatitis A.  Hepatitis B vaccine. You may need this if you have certain conditions or if you travel or work in places where you may be exposed to hepatitis B.  Haemophilus influenzae type b (Hib) vaccine. You may need this if you have certain risk factors.  Talk to your health care provider about which screenings and vaccines you need and how often you need them. This information is not intended to replace advice given to you by your health care provider. Make sure you discuss any questions you have with your health care provider. Document Released: 04/23/2001 Document Revised: 11/15/2015 Document Reviewed: 12/27/2014 Elsevier Interactive Patient Education  [employer].

## 2017-11-27 NOTE — Addendum Note (Signed)
Addended by: Johnella MoloneyFUNDERBURK, Dhanvi Boesen A on: 11/27/2017 09:46 AM   Modules accepted: Orders

## 2017-11-28 LAB — CYTOLOGY - PAP: DIAGNOSIS: NEGATIVE

## 2017-12-04 ENCOUNTER — Telehealth: Payer: Self-pay | Admitting: Family Medicine

## 2017-12-04 NOTE — Telephone Encounter (Signed)
Please schedule appointment to discuss other options.  Also would recommend that she talk with her pharmacy to see if they have a safe version of the losartan in stock from a different manufacturer.  Can give her a written prescription to check at other drugstores until she gets in to talk with [place] about other options if she wishes to change medications altogether.

## 2017-12-04 NOTE — Telephone Encounter (Signed)
Per Dr Selena Batten I called the pt and informed her to continue this as she has an appt on 10/1.  I also advised her of the previous message as she could contact the pharmacy for further info and she agreed.

## 2017-12-04 NOTE — Telephone Encounter (Signed)
Copied from CRM [id number]. Topic: Quick Communication - See Telephone Encounter >> Dec 04, 2017 10:32 AM Arlyss Gandy, NT wrote: CRM for notification. See Telephone encounter for: 12/04/17. Pt states that her pharmacy stated her losartan (COZAAR) 50 MG tablet was on recall and wants to see if something else can be ordered for her to take to replace this drug. [id number] [place] [address], [place] - [address] [phone] (Phone) [phone] (Fax)

## 2017-12-04 NOTE — Telephone Encounter (Signed)
Copied from CRM [id number]. Topic: Quick Communication - See Telephone Encounter >> Dec 04, 2017  2:54 PM Waymon Amato wrote: Pt did call back and schedule an appt for Monday to discuss her losartan and if she is going to change to a new medication but her questions is should she stop taking  The losartan till her appt   Best [phone]

## 2017-12-04 NOTE — Telephone Encounter (Signed)
See prior phone note. 

## 2017-12-05 MED ORDER — IRBESARTAN 75 MG PO TABS: 75.0000 mg | ORAL_TABLET | Freq: Every day | ORAL | 1 refills | Status: DC

## 2017-12-05 NOTE — Telephone Encounter (Signed)
Ok. Can send irbesartan 75mg  if they have that and it has not been recalled. Many different medications by certain manufacturers have been recalled. Recommend follow up in in 2-3 weeks to ensure this is working instead of next week if she wants to change to this.

## 2017-12-05 NOTE — Addendum Note (Signed)
Addended by: Johnella Moloney on: 12/05/2017 03:25 PM   Modules accepted: Orders

## 2017-12-05 NOTE — Telephone Encounter (Signed)
I called the pt and informed her of the message below and that the new Rx was sent to her pharmacy.  Follow up visit scheduled for 10/21.

## 2017-12-05 NOTE — Telephone Encounter (Signed)
Pt states she contacted the pharmacy and they can substitute with anything that is an "ARB".  They just need script sent from our office.   Pt can be reached at [phone]

## 2017-12-08 ENCOUNTER — Ambulatory Visit: Payer: 59 | Admitting: Family Medicine

## 2017-12-10 ENCOUNTER — Telehealth: Payer: Self-pay | Admitting: Family Medicine

## 2017-12-10 NOTE — Telephone Encounter (Signed)
Copied from CRM [id number]. Topic: General - Other >> Dec 10, 2017  9:55 AM Ronney Lion A wrote: Reason for CRM: Patient called in, she says the GYN that she was referred to by her pcp is requesting that we fax over her medical records.   Fax: [phone] Attn: Dr. Shirlean Schlein

## 2017-12-12 NOTE — Telephone Encounter (Signed)
I called the pt and informed her the office notes from 9/19 and pap smear results were faxed to Dr Henderson Cloud at [phone].

## 2017-12-12 NOTE — Telephone Encounter (Signed)
This patient stated she was referred to a GYN I did not see any referrals in the patient chart . Please see the CRM and please advise thank you.

## 2017-12-16 DIAGNOSIS — N888 Other specified noninflammatory disorders of cervix uteri: Secondary | ICD-10-CM | POA: Diagnosis not present

## 2017-12-26 ENCOUNTER — Ambulatory Visit: Payer: Self-pay | Admitting: Gynecology

## 2017-12-27 NOTE — Progress Notes (Signed)
HPI:  Using dictation device. Unfortunately this device frequently misinterprets words/phrases.  Kristy Ross is a pleasant 27 y.o. here for follow up. Chronic medical problems summarized below were reviewed for changes and stability and were updated as needed below. These issues and their treatment remain stable for the most part.  Reports is doing well on the new blood pressure medication.  Unfortunately, she recently found out her job/department has been terminated and she only has until find a new job.  This is been stressful.  However, because of counseling in the past, she feels like she is working through this better than she would have.  She has a counseling session set up later this week. Denies CP, SOB, DOE, treatment intolerance or new symptoms. She would like a refill of her albuterol.  Occasional symptoms with allergies.  No severe symptoms.  Hypertension: -meds: hctz, irbesartan (recently changed to this due to pharmacy and recall)  ASthma/allergy: -meds: zyrtec, alb, flonase  Morbid obesity: -wt 246 9/19  ROS: See pertinent positives and negatives per HPI.  Past Medical History:  Diagnosis Date  . Allergy   . Asthma   . Blood in stool   . BMI 40.0-44.9, adult (HCC) 12/04/2015  . Depression   . History of chicken pox   . Hypertension   . IBS (irritable bowel syndrome)     History reviewed. No pertinent surgical history.  Family History  Problem Relation Age of Onset  . Hypertension Father   . Breast cancer Mother   . Hyperlipidemia Maternal Grandfather   . Hyperlipidemia Paternal Grandmother   . Hyperlipidemia Paternal Grandfather   . Heart disease Father   . CVA Maternal Grandfather     SOCIAL HX: See HPI   Current Outpatient Medications:  .  cetirizine (ZYRTEC) 10 MG tablet, Take 10 mg by mouth daily., Disp: , Rfl:  .  fluticasone (FLONASE) 50 MCG/ACT nasal spray, Place into both nostrils daily., Disp: , Rfl:  .  hydrochlorothiazide (HYDRODIURIL) 25  MG tablet, TAKE 1 TABLET BY MOUTH EVERY DAY, Disp: 30 tablet, Rfl: 5 .  albuterol (PROVENTIL HFA;VENTOLIN HFA) 108 (90 Base) MCG/ACT inhaler, Inhale 2 puffs into the lungs every 6 (six) hours as needed., Disp: 1 Inhaler, Rfl: 3 .  irbesartan (AVAPRO) 75 MG tablet, Take 1 tablet (75 mg total) by mouth daily., Disp: 90 tablet, Rfl: 1  EXAM:  Vitals:   12/29/17 0930  BP: 116/74  Pulse: 71  Temp: 98.5 F (36.9 C)    Body mass index is 42.38 kg/m.  GENERAL: vitals reviewed and listed above, alert, oriented, appears well hydrated and in no acute distress  HEENT: atraumatic, conjunttiva clear, no obvious abnormalities on inspection of external nose and ears  NECK: no obvious masses on inspection  LUNGS: clear to auscultation bilaterally, no wheezes, rales or rhonchi, good air movement  CV: HRRR, no peripheral edema  MS: moves all extremities without noticeable abnormality  PSYCH: pleasant and cooperative, no obvious depression or anxiety  ASSESSMENT AND PLAN:  Discussed the following assessment and plan:  Essential hypertension - Plan: Basic metabolic panel, CBC  Mild intermittent asthma with acute exacerbation  Mild episode of recurrent major depressive disorder (HCC)  -labs per orders -medication refills -getting counseling to help with stress/ mild depressive symptoms situational,  follow up as needed -follow up 3 months -Patient advised to return or notify a doctor immediately if symptoms worsen or persist or new concerns arise.  Patient Instructions  BEFORE YOU LEAVE: -labs - do  not need to be fasting -follow up: 3 months  Do the counseling as planned. Please let us know if stress or symptoms worsen and you need further help. Hand in there!  We have ordered labs or studies at this visit. It can take up to 1-2 weeks for results and processing. IF results require follow up or explanation, we will call you with instructions. Clinically stable results will be released  to your [facility]. If you have not heard from [place] or cannot find your results in [facility] in 2 weeks please contact our office at [phone].  If you are not yet signed up for [facility], please consider signing up.   We recommend the following healthy lifestyle for LIFE: 1) Small portions. But, make sure to get regular (at least 3 per day), healthy meals and small healthy snacks if needed.  2) Eat a healthy clean diet.   TRY TO EAT: -at least 5-7 servings of low sugar, colorful, and nutrient rich vegetables per day (not corn, potatoes or bananas.) -berries are the best choice if you wish to eat fruit (only eat small amounts if trying to reduce weight)  -lean meets (fish, white meat of chicken or [place]) -vegan proteins for some meals - beans or tofu, whole grains, nuts and seeds -Replace bad fats with good fats - good fats include: fish, nuts and seeds, canola oil, olive oil -small amounts of low fat or non fat dairy -small amounts of100 % whole grains - check the lables -drink plenty of water  AVOID: -SUGAR, sweets, anything with added sugar, corn syrup or sweeteners - must read labels as even foods advertised as "healthy" often are loaded with sugar -if you must have a sweetener, small amounts of stevia may be best -sweetened beverages and artificially sweetened beverages -simple starches (rice, bread, potatoes, pasta, chips, etc - small amounts of 100% whole grains are ok) -red meat, pork, butter -fried foods, fast food, processed food, excessive dairy, eggs and coconut.  3)Get at least 150 minutes of sweaty aerobic exercise per week.  4)Reduce stress - consider counseling, meditation and relaxation to balance other aspects of your life.         Terressa Koyanagi, DO

## 2017-12-29 ENCOUNTER — Encounter: Payer: Self-pay | Admitting: Family Medicine

## 2017-12-29 ENCOUNTER — Ambulatory Visit (INDEPENDENT_AMBULATORY_CARE_PROVIDER_SITE_OTHER): Payer: 59 | Admitting: Family Medicine

## 2017-12-29 VITALS — BP 116/74 | HR 71 | Temp 98.5°F | Ht 64.0 in | Wt 246.9 lb

## 2017-12-29 DIAGNOSIS — I10 Essential (primary) hypertension: Secondary | ICD-10-CM | POA: Diagnosis not present

## 2017-12-29 DIAGNOSIS — J4521 Mild intermittent asthma with (acute) exacerbation: Secondary | ICD-10-CM

## 2017-12-29 DIAGNOSIS — F330 Major depressive disorder, recurrent, mild: Secondary | ICD-10-CM

## 2017-12-29 MED ORDER — ALBUTEROL SULFATE HFA 108 (90 BASE) MCG/ACT IN AERS: 2.0000 | INHALATION_SPRAY | Freq: Four times a day (QID) | RESPIRATORY_TRACT | 3 refills | Status: DC | PRN

## 2017-12-29 MED ORDER — IRBESARTAN 75 MG PO TABS: 75.0000 mg | ORAL_TABLET | Freq: Every day | ORAL | 1 refills | Status: DC

## 2017-12-29 NOTE — Patient Instructions (Signed)
BEFORE YOU LEAVE: -labs - do not need to be fasting -follow up: 3 months  Do the counseling as planned. Please let us know if stress or symptoms worsen and you need further help. Hand in there!  We have ordered labs or studies at this visit. It can take up to 1-2 weeks for results and processing. IF results require follow up or explanation, we will call you with instructions. Clinically stable results will be released to your [facility]. If you have not heard from [place] or cannot find your results in [facility] in 2 weeks please contact our office at [phone].  If you are not yet signed up for [facility], please consider signing up.   We recommend the following healthy lifestyle for LIFE: 1) Small portions. But, make sure to get regular (at least 3 per day), healthy meals and small healthy snacks if needed.  2) Eat a healthy clean diet.   TRY TO EAT: -at least 5-7 servings of low sugar, colorful, and nutrient rich vegetables per day (not corn, potatoes or bananas.) -berries are the best choice if you wish to eat fruit (only eat small amounts if trying to reduce weight)  -lean meets (fish, white meat of chicken or [place]) -vegan proteins for some meals - beans or tofu, whole grains, nuts and seeds -Replace bad fats with good fats - good fats include: fish, nuts and seeds, canola oil, olive oil -small amounts of low fat or non fat dairy -small amounts of100 % whole grains - check the lables -drink plenty of water  AVOID: -SUGAR, sweets, anything with added sugar, corn syrup or sweeteners - must read labels as even foods advertised as "healthy" often are loaded with sugar -if you must have a sweetener, small amounts of stevia may be best -sweetened beverages and artificially sweetened beverages -simple starches (rice, bread, potatoes, pasta, chips, etc - small amounts of 100% whole grains are ok) -red meat, pork, butter -fried foods, fast food, processed food, excessive dairy, eggs and  coconut.  3)Get at least 150 minutes of sweaty aerobic exercise per week.  4)Reduce stress - consider counseling, meditation and relaxation to balance other aspects of your life.

## 2018-01-27 ENCOUNTER — Ambulatory Visit: Payer: Self-pay | Admitting: Gynecology

## 2018-02-16 ENCOUNTER — Telehealth: Payer: Self-pay

## 2018-02-16 MED ORDER — PROAIR HFA 108 (90 BASE) MCG/ACT IN AERS: 1.0000 | INHALATION_SPRAY | Freq: Four times a day (QID) | RESPIRATORY_TRACT | 3 refills | Status: DC | PRN

## 2018-02-16 NOTE — Telephone Encounter (Signed)
Rx done. 

## 2018-02-16 NOTE — Telephone Encounter (Signed)
Copied from CRM [id number]. Topic: General - Other >> Feb 16, 2018  9:58 AM Percival SpanishKennedy, Cheryl W wrote:  [facility] [employer] [facility] on albuterol (PROVENTIL HFA;VENTOLIN HFA) 108 (90 Base) MCG/ACT inhaler   but has contacted them and would like to have Pro Air and if a new rx can be called in

## 2018-02-16 NOTE — Telephone Encounter (Signed)
Ok to call in proair instead. Thanks.

## 2018-03-23 ENCOUNTER — Telehealth: Payer: Self-pay | Admitting: *Deleted

## 2018-03-23 NOTE — Telephone Encounter (Signed)
Copied from CRM [id number]. Topic: General - Other >> Mar 23, 2018  3:34 PM Jaquita Rector A wrote: Reason for CRM: [facility]artan (AVAPRO) 75 MG tablet is on back order asking can they get something else in place of that please. Please advise  Ph#  [phone]

## 2018-03-24 NOTE — Telephone Encounter (Signed)
I was out of office when this was sent to me; I'll forward to you to determine alternative med.

## 2018-03-24 NOTE — Telephone Encounter (Signed)
[facility] called [facility] and spoke with Rosanne Ashing.  Rx was filled there as they have this in stock and I called the pt and informed her of this.

## 2018-03-24 NOTE — Telephone Encounter (Signed)
Please call patient. Would recommend staying with this medication if possible and can provide written script so she could try a different pharmacy? If she is agreeable please do so. If not, please call pharmacy to see what comparable meds they DO have in stock and then check with pt to see if ok to change. Thanks.

## 2018-03-26 NOTE — Progress Notes (Signed)
HPI:  Using dictation device. Unfortunately this device frequently misinterprets words/phrases.  Kristy Ross is a pleasant 28 y.o. here for follow up. Chronic medical problems summarized below were reviewed for changes and stability and were updated as needed below. These issues and their treatment remain stable for the most part.  Reports doing wt watchers again the last 3 weeks and has lost 7 lbs. Plans to stick with it. Getting regular exercise.  New concern of patch of itchy dry skin on R hand. Denies CP, SOB, DOE, treatment intolerance or new symptoms. Due for labs.  Hypertension: -meds: hctz, irbesartan (recently changed to this due to pharmacy and recall)  ASthma/allergy: -meds: zyrtec, alb, flonase  Morbid obesity: -wt 246 9/19--> wt 246 (03/30/2018)  ROS: See pertinent positives and negatives per HPI.  Past Medical History:  Diagnosis Date  . Allergy   . Asthma   . Blood in stool   . BMI 40.0-44.9, adult (HCC) 12/04/2015  . Depression   . History of chicken pox   . Hypertension   . IBS (irritable bowel syndrome)     History reviewed. No pertinent surgical history.  Family History  Problem Relation Age of Onset  . Hypertension Father   . Breast cancer Mother   . Hyperlipidemia Maternal Grandfather   . Hyperlipidemia Paternal Grandmother   . Hyperlipidemia Paternal Grandfather   . Heart disease Father   . CVA Maternal Grandfather     SOCIAL HX: see hpi   Current Outpatient Medications:  .  cetirizine (ZYRTEC) 10 MG tablet, Take 10 mg by mouth daily., Disp: , Rfl:  .  fluticasone (FLONASE) 50 MCG/ACT nasal spray, Place into both nostrils daily., Disp: , Rfl:  .  hydrochlorothiazide (HYDRODIURIL) 25 MG tablet, TAKE 1 TABLET BY MOUTH EVERY DAY, Disp: 30 tablet, Rfl: 5 .  irbesartan (AVAPRO) 75 MG tablet, Take 1 tablet (75 mg total) by mouth daily., Disp: 90 tablet, Rfl: 1 .  PROAIR HFA 108 (90 Base) MCG/ACT inhaler, Inhale 1 puff into the lungs every 6  (six) hours as needed for wheezing or shortness of breath., Disp: 1 Inhaler, Rfl: 3 .  triamcinolone ointment (KENALOG) 0.1 %, Apply 1 application topically 2 (two) times daily., Disp: 30 g, Rfl: 0  EXAM:  Vitals:   03/30/18 0824  BP: 112/82  Pulse: 86  Temp: 98.6 F (37 C)    Body mass index is 42.29 kg/m.  GENERAL: vitals reviewed and listed above, alert, oriented, appears well hydrated and in no acute distress  HEENT: atraumatic, conjunttiva clear, no obvious abnormalities on inspection of external nose and ears  NECK: no obvious masses on inspection  LUNGS: clear to auscultation bilaterally, no wheezes, rales or rhonchi, good air movement  CV: HRRR, no peripheral edema  SKIN: scaly erythematous plaque dorsal R hand  MS: moves all extremities without noticeable abnormality  PSYCH: pleasant and cooperative, no obvious depression or anxiety  ASSESSMENT AND PLAN:  Discussed the following assessment and plan:  Essential hypertension - Plan: Basic metabolic panel, CBC  Hyperglycemia  Hyperlipidemia, unspecified hyperlipidemia type - Plan: Lipid panel  Morbid obesity (HCC)  Hand eczema  -congratulated on lifestyle changes, recs for 20lb weight reduction with healthy low sugar diet and regular exercise -triam oint and emollient for hand with follow up if persists -labs per orders -cont current BP meds and adjust if needed if BP not improving with wt reductions -follow up 3 months -Patient advised to return or notify a doctor immediately if symptoms worsen  or persist or new concerns arise.  Patient Instructions  BEFORE YOU LEAVE: -labs -follow up: 3 months - MAKE SURE TO come to your CHECK-IN APPOINTMENT 15 Minutes prior to your doctor appointment or you may be required to reschedule.  Recommend a 20 lb weight reduction.  Aquaphor and the steroid cream for the hand. Call if does not resolve over the next few weeks.  We have ordered labs or studies at this  visit. It can take up to 1-2 weeks for results and processing. IF results require follow up or explanation, we will call you with instructions. Clinically stable results will be released to your [facility]. If you have not heard from [place] or cannot find your results in [facility] in 2 weeks please contact our office at [phone].  If you are not yet signed up for [facility], please consider signing up.   We recommend the following healthy lifestyle for LIFE: 1) Small portions. But, make sure to get regular (at least 3 per day), healthy meals and small healthy snacks if needed.  2) Eat a healthy clean diet.   TRY TO EAT: -at least 5-7 servings of low sugar, colorful, and nutrient rich vegetables per day (not corn, potatoes or bananas.) -berries are the best choice if you wish to eat fruit (only eat small amounts if trying to reduce weight)  -lean meets (fish, white meat of chicken or [place]) -vegan proteins for some meals - beans or tofu, whole grains, nuts and seeds -Replace bad fats with good fats - good fats include: fish, nuts and seeds, canola oil, olive oil -small amounts of low fat or non fat dairy -small amounts of100 % whole grains - check the lables -drink plenty of water  AVOID: -SUGAR, sweets, anything with added sugar, corn syrup or sweeteners - must read labels as even foods advertised as "healthy" often are loaded with sugar -if you must have a sweetener, small amounts of stevia may be best -sweetened beverages and artificially sweetened beverages -simple starches (rice, bread, potatoes, pasta, chips, etc - small amounts of 100% whole grains are ok) -red meat, pork, butter -fried foods, fast food, processed food, excessive dairy, eggs and coconut.  3)Get at least 150 minutes of sweaty aerobic exercise per week.  4)Reduce stress - consider counseling, meditation and relaxation to balance other aspects of your life.          Terressa Koyanagi, DO

## 2018-03-30 ENCOUNTER — Ambulatory Visit: Payer: 59 | Admitting: Family Medicine

## 2018-03-30 ENCOUNTER — Encounter: Payer: Self-pay | Admitting: Family Medicine

## 2018-03-30 VITALS — BP 112/82 | HR 86 | Temp 98.6°F | Ht 64.0 in | Wt 246.4 lb

## 2018-03-30 DIAGNOSIS — I10 Essential (primary) hypertension: Secondary | ICD-10-CM | POA: Diagnosis not present

## 2018-03-30 DIAGNOSIS — R739 Hyperglycemia, unspecified: Secondary | ICD-10-CM | POA: Diagnosis not present

## 2018-03-30 DIAGNOSIS — E785 Hyperlipidemia, unspecified: Secondary | ICD-10-CM | POA: Diagnosis not present

## 2018-03-30 DIAGNOSIS — L309 Dermatitis, unspecified: Secondary | ICD-10-CM

## 2018-03-30 LAB — CBC
HCT: 43.5 % (ref 36.0–46.0)
Hemoglobin: 14.9 g/dL (ref 12.0–15.0)
MCHC: 34.2 g/dL (ref 30.0–36.0)
MCV: 90.4 fl (ref 78.0–100.0)
PLATELETS: 368 10*3/uL (ref 150.0–400.0)
RBC: 4.81 Mil/uL (ref 3.87–5.11)
RDW: 14 % (ref 11.5–15.5)
WBC: 7.6 10*3/uL (ref 4.0–10.5)

## 2018-03-30 LAB — BASIC METABOLIC PANEL
BUN: 12 mg/dL (ref 6–23)
CALCIUM: 9.5 mg/dL (ref 8.4–10.5)
CO2: 27 mEq/L (ref 19–32)
Chloride: 102 mEq/L (ref 96–112)
Creatinine, Ser: 0.87 mg/dL (ref 0.40–1.20)
GFR: 77.03 mL/min (ref >60.00)
Glucose, Bld: 84 mg/dL (ref 70–99)
Potassium: 4.4 mEq/L (ref 3.5–5.1)
SODIUM: 140 meq/L (ref 135–145)

## 2018-03-30 LAB — LIPID PANEL
Cholesterol: 160 mg/dL (ref 0–200)
HDL: 45.5 mg/dL (ref >39.00)
LDL CALC: 89 mg/dL (ref 0–99)
NonHDL: 114.37
Total CHOL/HDL Ratio: 4
Triglycerides: 129 mg/dL (ref 0.0–149.0)
VLDL: 25.8 mg/dL (ref 0.0–40.0)

## 2018-03-30 MED ORDER — TRIAMCINOLONE ACETONIDE 0.1 % EX OINT: 1.0000 "application " | TOPICAL_OINTMENT | Freq: Two times a day (BID) | CUTANEOUS | 0 refills | Status: DC

## 2018-03-30 NOTE — Patient Instructions (Addendum)
BEFORE YOU LEAVE: -labs -follow up: 3 months - MAKE SURE TO come to your CHECK-IN APPOINTMENT 15 Minutes prior to your doctor appointment or you may be required to reschedule.  Recommend a 20 lb weight reduction.  Aquaphor and the steroid cream for the hand. Call if does not resolve over the next few weeks.  We have ordered labs or studies at this visit. It can take up to 1-2 weeks for results and processing. IF results require follow up or explanation, we will call you with instructions. Clinically stable results will be released to your [facility]. If you have not heard from [place] or cannot find your results in [facility] in 2 weeks please contact our office at [phone].  If you are not yet signed up for [facility], please consider signing up.   We recommend the following healthy lifestyle for LIFE: 1) Small portions. But, make sure to get regular (at least 3 per day), healthy meals and small healthy snacks if needed.  2) Eat a healthy clean diet.   TRY TO EAT: -at least 5-7 servings of low sugar, colorful, and nutrient rich vegetables per day (not corn, potatoes or bananas.) -berries are the best choice if you wish to eat fruit (only eat small amounts if trying to reduce weight)  -lean meets (fish, white meat of chicken or [place]) -vegan proteins for some meals - beans or tofu, whole grains, nuts and seeds -Replace bad fats with good fats - good fats include: fish, nuts and seeds, canola oil, olive oil -small amounts of low fat or non fat dairy -small amounts of100 % whole grains - check the lables -drink plenty of water  AVOID: -SUGAR, sweets, anything with added sugar, corn syrup or sweeteners - must read labels as even foods advertised as "healthy" often are loaded with sugar -if you must have a sweetener, small amounts of stevia may be best -sweetened beverages and artificially sweetened beverages -simple starches (rice, bread, potatoes, pasta, chips, etc - small amounts of 100% whole  grains are ok) -red meat, pork, butter -fried foods, fast food, processed food, excessive dairy, eggs and coconut.  3)Get at least 150 minutes of sweaty aerobic exercise per week.  4)Reduce stress - consider counseling, meditation and relaxation to balance other aspects of your life.

## 2018-04-14 ENCOUNTER — Other Ambulatory Visit: Payer: Self-pay | Admitting: Family Medicine

## 2018-04-15 NOTE — Progress Notes (Signed)
HPI:  Using dictation device. Unfortunately this device frequently misinterprets words/phrases.   Acute visit for respiratory illness: -started: about 1 week ago with a cold -symptoms:nasal congestion, sore throat, cough, then some wheezing, feeling tired, increased alb use multiple times per day, now discolored sputum -denies:fever, SOB, NVD, tooth pain, body aches -has tried: alb -sick contacts/travel/risks: no reported travel to [place], [place] exposure,  flu, strep or tick exposure -Hx of: asthma  ROS: See pertinent positives and negatives per HPI.  Past Medical History:  Diagnosis Date  . Allergy   . Asthma   . Blood in stool   . BMI 40.0-44.9, adult (HCC) 12/04/2015  . Depression   . History of chicken pox   . Hypertension   . IBS (irritable bowel syndrome)     History reviewed. No pertinent surgical history.  Family History  Problem Relation Age of Onset  . Hypertension Father   . Breast cancer Mother   . Hyperlipidemia Maternal Grandfather   . Hyperlipidemia Paternal Grandmother   . Hyperlipidemia Paternal Grandfather   . Heart disease Father   . CVA Maternal Grandfather     Social History   Socioeconomic History  . Marital status: Single    Spouse name: Not on file  . Number of children: Not on file  . Years of education: Not on file  . Highest education level: Not on file  Occupational History  . Not on file  Social Needs  . Financial resource strain: Not on file  . Food insecurity:    Worry: Not on file    Inability: Not on file  . Transportation needs:    Medical: Not on file    Non-medical: Not on file  Tobacco Use  . Smoking status: Never Smoker  . Smokeless tobacco: Never Used  Substance and Sexual Activity  . Alcohol use: No    Alcohol/week: 0.0 standard drinks  . Drug use: No  . Sexual activity: Not on file  Lifestyle  . Physical activity:    Days per week: Not on file    Minutes per session: Not on file  . Stress: Not on file   Relationships  . Social connections:    Talks on phone: Not on file    Gets together: Not on file    Attends religious service: Not on file    Active member of club or organization: Not on file    Attends meetings of clubs or organizations: Not on file    Relationship status: Not on file  Other Topics Concern  . Not on file  Social History Narrative   Work or School: Paediatric nursedevelopment assistant for [employer]      Home Situation: lives alone      Spiritual Beliefs: Christian      Lifestyle: no regular exercise - but starting to work on this and diet        Current Outpatient Medications:  .  albuterol (PROVENTIL HFA;VENTOLIN HFA) 108 (90 Base) MCG/ACT inhaler, Inhale into the lungs every 6 (six) hours as needed for wheezing or shortness of breath., Disp: , Rfl:  .  cetirizine (ZYRTEC) 10 MG tablet, Take 10 mg by mouth daily., Disp: , Rfl:  .  fluticasone (FLONASE) 50 MCG/ACT nasal spray, Place into both nostrils daily., Disp: , Rfl:  .  hydrochlorothiazide (HYDRODIURIL) 25 MG tablet, TAKE 1 TABLET BY MOUTH EVERY DAY, Disp: 30 tablet, Rfl: 5 .  irbesartan (AVAPRO) 75 MG tablet, Take 1 tablet (75 mg total) by mouth  daily., Disp: 90 tablet, Rfl: 1 .  triamcinolone ointment (KENALOG) 0.1 %, Apply 1 application topically 2 (two) times daily., Disp: 30 g, Rfl: 0  EXAM:  Vitals:   04/16/18 0741  BP: 110/80  Pulse: 90  Temp: 98.7 F (37.1 C)  SpO2: 98%    Body mass index is 42.72 kg/m.  GENERAL: vitals reviewed and listed above, alert, oriented, appears well hydrated and in no acute distress  HEENT: atraumatic, conjunttiva clear, no obvious abnormalities on inspection of external nose and ears, normal appearance of ear canals and TMs, clear nasal congestion, mild post oropharyngeal erythema with PND, no tonsillar edema or exudate, no sinus TTP  NECK: no obvious masses on inspection  LUNGS: clear to auscultation bilaterally, no wheezes, rales or rhonchi, good air movement  CV: HRRR,  no peripheral edema  MS: moves all extremities without noticeable abnormality  PSYCH: pleasant and cooperative, no obvious depression or anxiety  ASSESSMENT AND PLAN:  Discussed the following assessment and plan:  Respiratory illness - Plan: DG Chest 2 View  Mild persistent asthma with exacerbation - Plan: DG Chest 2 View  Cough - Plan: DG Chest 2 View  -. We discussed potential etiologies, with VURI with asthma exacerbation being most likely. We discussed treatment side effects, likely course, antibiotic misuse, transmission, and signs of developing a serious illness. Possible mild influenza, but out of window for likely benefit from tamiflu. Opted for prednisone for the asthma, prn alb, CXR to exclude LRI, low threshold to add abx if cxr suggests LRI or not improving soon. -of course, we advised to return or notify a doctor immediately if symptoms worsen or persist or new concerns arise.    Patient Instructions  BEFORE YOU LEAVE: -xray  Take the prednisone per instructions.  Get the xray.  Albuterol per instructions as needed.  I hope you are feeling better soon! Seek care promptly if your symptoms worsen, new concerns arise or you are not improving with treatment.      Terressa KoyanagiHannah R Carliyah Cotterman, DO

## 2018-04-16 ENCOUNTER — Ambulatory Visit (INDEPENDENT_AMBULATORY_CARE_PROVIDER_SITE_OTHER): Payer: 59

## 2018-04-16 ENCOUNTER — Ambulatory Visit: Payer: 59 | Admitting: Family Medicine

## 2018-04-16 ENCOUNTER — Encounter: Payer: Self-pay | Admitting: Family Medicine

## 2018-04-16 VITALS — BP 110/80 | HR 90 | Temp 98.7°F | Ht 64.0 in | Wt 248.9 lb

## 2018-04-16 DIAGNOSIS — J989 Respiratory disorder, unspecified: Secondary | ICD-10-CM | POA: Diagnosis not present

## 2018-04-16 DIAGNOSIS — R05 Cough: Secondary | ICD-10-CM

## 2018-04-16 DIAGNOSIS — J4531 Mild persistent asthma with (acute) exacerbation: Secondary | ICD-10-CM

## 2018-04-16 DIAGNOSIS — R059 Cough, unspecified: Secondary | ICD-10-CM

## 2018-04-16 MED ORDER — PREDNISONE 20 MG PO TABS: 40.0000 mg | ORAL_TABLET | Freq: Every day | ORAL | 0 refills | Status: DC

## 2018-04-16 NOTE — Patient Instructions (Signed)
BEFORE YOU LEAVE: -xray  Take the prednisone per instructions.  Get the xray.  Albuterol per instructions as needed.  I hope you are feeling better soon! Seek care promptly if your symptoms worsen, new concerns arise or you are not improving with treatment.

## 2018-06-04 ENCOUNTER — Ambulatory Visit (INDEPENDENT_AMBULATORY_CARE_PROVIDER_SITE_OTHER): Payer: 59 | Admitting: Family Medicine

## 2018-06-04 ENCOUNTER — Encounter: Payer: Self-pay | Admitting: Family Medicine

## 2018-06-04 ENCOUNTER — Other Ambulatory Visit: Payer: Self-pay

## 2018-06-04 ENCOUNTER — Telehealth: Payer: Self-pay | Admitting: Family Medicine

## 2018-06-04 VITALS — Temp 98.7°F

## 2018-06-04 DIAGNOSIS — J302 Other seasonal allergic rhinitis: Secondary | ICD-10-CM

## 2018-06-04 DIAGNOSIS — J45909 Unspecified asthma, uncomplicated: Secondary | ICD-10-CM

## 2018-06-04 MED ORDER — PREDNISONE 20 MG PO TABS: 40.0000 mg | ORAL_TABLET | Freq: Every day | ORAL | 0 refills | Status: DC

## 2018-06-04 MED ORDER — FLUTICASONE PROPIONATE HFA 44 MCG/ACT IN AERO: 1.0000 | INHALATION_SPRAY | Freq: Two times a day (BID) | RESPIRATORY_TRACT | 0 refills | Status: DC

## 2018-06-04 NOTE — Telephone Encounter (Signed)
I called the pt and scheduled a Webex appt today at 3:15pm.

## 2018-06-04 NOTE — Telephone Encounter (Signed)
Copied from CRM [id number]. Topic: Quick Communication - See Telephone Encounter >> Jun 04, 2018  1:38 PM Jens Som A wrote: CRM for notification. See Telephone encounter for: 06/04/18.  Patient is interested interested in scheduling a virtual visit with Dr. Selena Batten. The patient does not have a fever, asmtha issues related to allergies. Can not be relived by the patient inhaler.  Please advise Thank you

## 2018-06-04 NOTE — Progress Notes (Signed)
Virtual Visit via Telephone Note  I connected with Kristy Ross on 06/04/18 at  3:15 PM EDT by telephone and verified that I am speaking with the correct person using two identifiers.   I discussed the limitations, risks, security and privacy concerns of performing an evaluation and management service by telephone and the availability of in person appointments. I also discussed with the patient that there may be a patient responsible charge related to this service. The patient expressed understanding and agreed to proceed.  Location patient: home Location provider: work or home office Participants present for the call: patient, provider Patient did not have a visit in the prior 7 days to address this/these issue(s).   History of Present Illness:  "allergies" and "asthma": -x 1.5 weeks - has been working from home because of COVID19 for several weeks so around her cat more and having to clean which aggravates her allergies and asthma -cough, wheezing, mild sob, itchy throat, sneezing, itchy eyes - typical for her this time of year; had similar symptom sin February relieved with prednisone -taking zyrtec, flonase, as needed albuterol - using the albuterol multiple times per day -having daily symptoms, daily nighttime symptoms -at home for the past two weeks, no travel, has not been around anyone  -no fevers, chills, sore throat, body aches, GI symptoms  Observations/Objective: Patient sounds cheerful and well on the phone. I do not appreciate any SOB. Speech and thought processing are grossly intact. Patient reported vitals: none  Assessment and Plan:  Seasonal allergic rhinitis, unspecified trigger  Moderate asthma, unspecified whether complicated, unspecified whether persistent  -we discussed possible serious and likely etiologies, workup and treatment, treatment risks and return precautions; suspect asthma exacerbation triggered by dander/dust/seasonal allergies.  -after this  discussion, Kristy Ross opted for short course oral steroid given degree of symptoms and then start inhaled steroid as this is the 2nd steroid burst she has required in the last few months -follow up advised closely in 5-7 days, also advised assistant to check on her in the interim and advised pt to see prompt care if any worsening or not improving  I did not refer this patient for an OV in the next 24 hours for this/these issue(s).  I discussed the assessment and treatment plan with the patient. The patient was provided an opportunity to ask questions and all were answered. The patient agreed with the plan and demonstrated an understanding of the instructions.   The patient was advised to call back or seek an in-person evaluation if the symptoms worsen or if the condition fails to improve as anticipated.   Follow up instructions: Advised assistant Ronnald Collum to help patient arrange the following: -call patient Friday and Monday to check on her - advise to go to ER if worsening -follow up Webx visit with Dr. Selena Batten in 5-7 days    I provided 13.25 minutes of non-face-to-face time during this encounter.   Terressa Koyanagi, DO

## 2018-06-08 ENCOUNTER — Telehealth: Payer: Self-pay | Admitting: *Deleted

## 2018-06-08 NOTE — Telephone Encounter (Signed)
Per Dr Selena Batten, I called the pt to see how she was doing today and advised to go to the ER if symptoms are worsening.  Patient stated she is feeling a lot better and agreed to go to ER if she is feeling worse.

## 2018-06-11 ENCOUNTER — Other Ambulatory Visit: Payer: Self-pay

## 2018-06-11 ENCOUNTER — Ambulatory Visit (INDEPENDENT_AMBULATORY_CARE_PROVIDER_SITE_OTHER): Payer: 59 | Admitting: Family Medicine

## 2018-06-11 ENCOUNTER — Encounter: Payer: Self-pay | Admitting: Family Medicine

## 2018-06-11 DIAGNOSIS — I10 Essential (primary) hypertension: Secondary | ICD-10-CM

## 2018-06-11 DIAGNOSIS — J309 Allergic rhinitis, unspecified: Secondary | ICD-10-CM | POA: Diagnosis not present

## 2018-06-11 DIAGNOSIS — J4540 Moderate persistent asthma, uncomplicated: Secondary | ICD-10-CM | POA: Diagnosis not present

## 2018-06-11 NOTE — Progress Notes (Signed)
Virtual Visit via Video Note  I connected with Kristy Ross on 06/11/18 at  1:30 PM EDT by a video enabled telemedicine application and verified that I am speaking with the correct person using two identifiers.  Location patient: home Location provider:work or home office Persons participating in the virtual visit: patient, provider  I discussed the limitations of evaluation and management by telemedicine and the availability of in person appointments. The patient expressed understanding and agreed to proceed.   HPI:  Follow up cough/wheezing/asthma/allergies: -sees detail current illness from visit 06/04/18 -reviewed today -we treated her asthma exacerbation with prednisone, she has had worseing asthma and several exacerbation requiring oral steroids recently. We opted to get her started on a maintenance medication. -today she reports doing "so great" - reports all SOB, wheezing and coughing has resolved, she has not needed her albuterol in several days -she continues her zyrtec and is using the flovent 1 puff twice daily  In terms of her blood pressure, she has felt fine and continues her medications. Does not have a home blood pressure cuff but agrees to order one so can monitor without coming in in light of the current COVID 19 pandemic.   ROS: See pertinent positives and negatives per HPI.  Past Medical History:  Diagnosis Date  . Allergy   . Asthma   . Blood in stool   . BMI 40.0-44.9, adult (HCC) 12/04/2015  . Depression   . History of chicken pox   . Hypertension   . IBS (irritable bowel syndrome)     History reviewed. No pertinent surgical history.  Family History  Problem Relation Age of Onset  . Hypertension Father   . Breast cancer Mother   . Hyperlipidemia Maternal Grandfather   . Hyperlipidemia Paternal Grandmother   . Hyperlipidemia Paternal Grandfather   . Heart disease Father   . CVA Maternal Grandfather     SOCIAL HX: see hpi   Current Outpatient  Medications:  .  albuterol (PROVENTIL HFA;VENTOLIN HFA) 108 (90 Base) MCG/ACT inhaler, Inhale into the lungs every 6 (six) hours as needed for wheezing or shortness of breath., Disp: , Rfl:  .  cetirizine (ZYRTEC) 10 MG tablet, Take 10 mg by mouth daily., Disp: , Rfl:  .  fluticasone (FLONASE) 50 MCG/ACT nasal spray, Place into both nostrils daily., Disp: , Rfl:  .  fluticasone (FLOVENT HFA) 44 MCG/ACT inhaler, Inhale 1 puff into the lungs 2 (two) times daily., Disp: 3 Inhaler, Rfl: 0 .  hydrochlorothiazide (HYDRODIURIL) 25 MG tablet, TAKE 1 TABLET BY MOUTH EVERY DAY, Disp: 30 tablet, Rfl: 5 .  irbesartan (AVAPRO) 75 MG tablet, Take 1 tablet (75 mg total) by mouth daily., Disp: 90 tablet, Rfl: 1 .  triamcinolone ointment (KENALOG) 0.1 %, Apply 1 application topically 2 (two) times daily., Disp: 30 g, Rfl: 0  EXAM:  VITALS per patient if applicable:none  GENERAL: alert, oriented, appears well and in no acute distress  HEENT: atraumatic, conjunttiva clear, no obvious abnormalities on inspection of external nose and ears  NECK: normal movements of the head and neck  LUNGS: on inspection no signs of respiratory distress, breathing rate appears normal, no obvious gross SOB, gasping or wheezing  CV: no obvious cyanosis  MS: moves all visible extremities without noticeable abnormality  PSYCH/NEURO: pleasant and cooperative, no obvious depression or anxiety, speech and thought processing grossly intact  ASSESSMENT AND PLAN:  Discussed the following assessment and plan:  Moderate persistent asthma, unspecified whether complicated  Allergic rhinitis, unspecified seasonality,  unspecified trigger  Essential hypertension  So glad she is doing better Advised to continue current regimen She agrees to order a BP cuff and monitor blood pressure at home, I advised her to email her BP readings in 1-2 weeks She is aware I am leaving clinical practice. We discussion options for a new PCP. She  would like to do a TOC visit with Dr. Hassan Rowan in the next few months. Will let my assistant know so that she can arrange.   I discussed the assessment and treatment plan with the patient. The patient was provided an opportunity to ask questions and all were answered. The patient agreed with the plan and demonstrated an understanding of the instructions.   The patient was advised to call back or seek an in-person evaluation if the symptoms worsen or if the condition fails to improve as anticipated.   Follow up instructions: Advised assistant Ronnald Collum to help patient arrange the following: -TOC with Dr. Hassan Rowan in 2-3 months Kristy Koyanagi, DO

## 2018-06-29 ENCOUNTER — Ambulatory Visit: Payer: 59 | Admitting: Family Medicine

## 2018-07-07 ENCOUNTER — Other Ambulatory Visit: Payer: Self-pay | Admitting: Family Medicine

## 2018-09-19 ENCOUNTER — Other Ambulatory Visit: Payer: Self-pay | Admitting: Family Medicine

## 2018-09-23 ENCOUNTER — Encounter: Payer: 59 | Admitting: Family Medicine

## 2018-09-24 ENCOUNTER — Other Ambulatory Visit: Payer: Self-pay | Admitting: Family Medicine

## 2018-11-05 ENCOUNTER — Encounter: Payer: Self-pay | Admitting: Family Medicine

## 2018-11-06 ENCOUNTER — Encounter: Payer: Self-pay | Admitting: Family Medicine

## 2018-11-06 ENCOUNTER — Other Ambulatory Visit: Payer: Self-pay

## 2018-11-06 ENCOUNTER — Ambulatory Visit (INDEPENDENT_AMBULATORY_CARE_PROVIDER_SITE_OTHER): Payer: PRIVATE HEALTH INSURANCE | Admitting: Family Medicine

## 2018-11-06 VITALS — BP 128/89 | Wt 243.0 lb

## 2018-11-06 DIAGNOSIS — R5383 Other fatigue: Secondary | ICD-10-CM

## 2018-11-06 DIAGNOSIS — I10 Essential (primary) hypertension: Secondary | ICD-10-CM | POA: Diagnosis not present

## 2018-11-06 DIAGNOSIS — L680 Hirsutism: Secondary | ICD-10-CM

## 2018-11-06 DIAGNOSIS — J302 Other seasonal allergic rhinitis: Secondary | ICD-10-CM

## 2018-11-06 DIAGNOSIS — J4521 Mild intermittent asthma with (acute) exacerbation: Secondary | ICD-10-CM | POA: Diagnosis not present

## 2018-11-06 DIAGNOSIS — Z6841 Body Mass Index (BMI) 40.0 and over, adult: Secondary | ICD-10-CM | POA: Diagnosis not present

## 2018-11-06 DIAGNOSIS — Z131 Encounter for screening for diabetes mellitus: Secondary | ICD-10-CM

## 2018-11-06 NOTE — Progress Notes (Addendum)
Virtual Visit via Video Note  I connected with Kristy AnoNicole Mcmonigle   on 11/06/18 at 10:00 AM EDT by a video enabled telemedicine application and verified that I am speaking with the correct person using two identifiers.  Location patient: home Location provider:work office Persons participating in the virtual visit: patient, provider  I discussed the limitations of evaluation and management by telemedicine and the availability of in person appointments. The patient expressed understanding and agreed to proceed.   Kristy Ross DOB: 08/12/1990 Encounter date: 11/06/2018  This is a 28 y.o. female who presents to establish care. Chief Complaint  Patient presents with  . Establish Care   [facility]. Last CPE was 11/27/17.   History of present illness: Looking to make health priority as she turns 29. Lately has been noticing other issues than her chronic conditions.   Periods are always regular. Last for about 4-6 days. 1 heavy day changing every 2 hours.   Has some facial hairs growing on neck and under chin. Wonders if related to hormones/weight. Also some hair upper back. Feels like hair on arms has also changed. Longer, not laying like it used to and are a little darker.   Has been doing weight watchers and wants to view food as nutrition and not points. Has been doing weight watchers on and off for years. Just feels like she can't stick to this. Lost 16lb this summer but was with family. Gained 6lbs when she came back. Feels like she can't stick to it because she gets tired of looking at all details. Feels like emotional eating/impulse and portion control are biggest weaknesses for her. Has dealt with binge eating in past, but hasn't done this recently.   In past few months has been regularly walking and has doing some weight training. Doing app on you tube. Walking 30 minutes 3 times/week. Sometimes she does more.   HTN: irbesartan; today reading was 123/89 but usually  running 120's/low 80's. Weight 243 today.   Asthma mild intermittent: flovent, proair prn. Breathing has been good. Daily steroid has been Secretary/administratorgame changer for her.   Allergies:zyrtec, flonase. Well controlled.   Depression/anxiety hisotry on and off since age 28. Has been on zoloft, prozac but prefers to manage off medications. not seeing counselor at this point. Mood is doing pretty well in spite of everything/COVID. Doesn't feel depressed. Talks about feelings and feels that faith also helps her.   IBS: diarrhea type. Usually goes to bathroom 4-7 times daily. Has been issue since teenager. Did see GI specialist and they were able to determine stress/dietary triggers so she tries to avoid those triggers. Avoiding dairy, gluten, wheat. Worse when stressed out. No blood in stools.   Has been sewing a lot. Is in [employer], [employer].    Some snoring, not much. Not loud and no stopping breathing.   Past Medical History:  Diagnosis Date  . Allergy   . Asthma   . Blood in stool   . BMI 40.0-44.9, adult ([facility]) 12/04/2015  . Depression   . History of chicken pox   . Hypertension   . IBS (irritable bowel syndrome)    History reviewed. No pertinent surgical history. Allergies  Allergen Reactions  . Bee Venom Anaphylaxis  . Niacin And Related Hives   Current Meds  Medication Sig  . cetirizine (ZYRTEC) 10 MG tablet Take 10 mg by mouth daily.  . fluticasone (FLONASE) 50 MCG/ACT nasal spray Place into both nostrils daily.  . fluticasone (  FLOVENT HFA) 44 MCG/ACT inhaler Inhale 1 puff into the lungs 2 (two) times daily.  . hydrochlorothiazide (HYDRODIURIL) 25 MG tablet TAKE 1 TABLET BY MOUTH EVERY DAY  . irbesartan (AVAPRO) 75 MG tablet TAKE 1 TABLET BY MOUTH EVERY DAY  . PROAIR HFA 108 (90 Base) MCG/ACT inhaler INHALE 1 PUFF INTO THE LUNGS EVERY 6 (SIX) HOURS AS NEEDED FOR WHEEZING OR SHORTNESS OF BREATH.  . triamcinolone ointment (KENALOG) 0.1 % Apply 1 application topically 2 (two) times daily.    Social History   Tobacco Use  . Smoking status: Never Smoker  . Smokeless tobacco: Never Used  Substance Use Topics  . Alcohol use: No    Alcohol/week: 0.0 standard drinks    Comment: rare   Family History  Problem Relation Age of Onset  . Hypertension Father   . Heart disease Father   . Breast cancer Mother 12  . Hyperlipidemia Maternal Grandfather   . CVA Maternal Grandfather 80  . Hyperlipidemia Paternal Grandmother   . Hyperlipidemia Paternal Grandfather   . Stroke Paternal Grandfather 63  . High blood pressure Brother   . High Cholesterol Brother   . Other Maternal Grandmother        brain tumor; not cancer     Review of Systems  Constitutional: Positive for fatigue (little). Negative for chills and fever.  Respiratory: Negative for cough, chest tightness, shortness of breath and wheezing.   Cardiovascular: Negative for chest pain, palpitations and leg swelling.  Gastrointestinal: Positive for diarrhea. Negative for nausea and vomiting.  Skin:       Increased facial hair  Psychiatric/Behavioral: Positive for sleep disturbance (not well in last week). The patient is nervous/anxious (controlled).    Depression screen [facility] 2/9 11/06/2018 04/16/2018 03/30/2018 12/29/2017 11/27/2017  Decreased Interest 0 0 0 0 2  Down, Depressed, Hopeless 0 0 0 0 1  PHQ - 2 Score 0 0 0 0 3  Altered sleeping - 1 0 0 0  Tired, decreased energy - 2 0 1 2  Change in appetite - 2 0 3 3  Feeling bad or failure about yourself  - 0 0 0 3  Trouble concentrating - 0 0 0 0  Moving slowly or fidgety/restless - 1 0 0 1  Suicidal thoughts - 0 0 0 0  PHQ-9 Score - 6 0 4 12     Objective:  There were no vitals taken for this visit.      BP Readings from Last 3 Encounters:  04/16/18 110/80  03/30/18 112/82  12/29/17 116/74   Wt Readings from Last 3 Encounters:  04/16/18 248 lb 14.4 oz (112.9 kg)  03/30/18 246 lb 6.4 oz (111.8 kg)  12/29/17 246 lb 14.4 oz (112 kg)    EXAM:  GENERAL:  alert, oriented, appears well and in no acute distress  HEENT: atraumatic, conjunctiva clear, no obvious abnormalities on inspection of external nose and ears  NECK: normal movements of the head and neck  LUNGS: on inspection no signs of respiratory distress, breathing rate appears normal, no obvious gross SOB, gasping or wheezing  CV: no obvious cyanosis  MS: moves all visible extremities without noticeable abnormality  PSYCH/NEURO: pleasant and cooperative, no obvious depression or anxiety, speech and thought processing grossly intact  SKIN: no facial or neck abnormalities.   Assessment/Plan 1. Essential hypertension Typically well controlled. Continue home checking. Continue current medication.  - CBC with Differential/Platelet; Future - Comprehensive metabolic panel; Future  2. Mild intermittent asthma with acute exacerbation  Stable; continue with current inhalers.  3. BMI 40.0-44.9, adult ([place]) Wants to work on weight loss. Suggested she look into [facility]. She will let me know if this is not good fit for her. Very motivated! Would like to at least meet with dietician.  4. Seasonal allergies Stable. Continue current otc medications.   5. Hirsutism Start with bloodwork; follow up pending this. - DHEA-sulfate; Future - Testosterone; Future  6. Screening for diabetes mellitus - Hemoglobin A1c; Future  7. Other fatigue - VITAMIN D 25 Hydroxy (Vit-D Deficiency, Fractures); Future - Vitamin B12; Future - TSH; Future  8. Class 3 Obesity: see above  I discussed the assessment and treatment plan with the patient. The patient was provided an opportunity to ask questions and all were answered. The patient agreed with the plan and demonstrated an understanding of the instructions.   The patient was advised to call back or seek an in-person evaluation if the symptoms worsen or if the condition fails to improve as anticipated.  I provided 35 minutes of non-face-to-face time  during this encounter.   Micheline Rough, MD

## 2018-11-09 ENCOUNTER — Telehealth: Payer: Self-pay | Admitting: *Deleted

## 2018-11-09 NOTE — Telephone Encounter (Signed)
-----   Message from Caren Macadam, MD sent at 11/06/2018 10:40 AM EDT ----- Please set up bloodwork visit for her.

## 2018-11-09 NOTE — Telephone Encounter (Signed)
Appt scheduled for 9/10 at 8am.

## 2018-11-12 ENCOUNTER — Other Ambulatory Visit: Payer: Self-pay | Admitting: Family Medicine

## 2018-11-18 ENCOUNTER — Telehealth: Payer: Self-pay | Admitting: *Deleted

## 2018-11-18 ENCOUNTER — Other Ambulatory Visit: Payer: Self-pay | Admitting: Family Medicine

## 2018-11-18 ENCOUNTER — Encounter: Payer: Self-pay | Admitting: Family Medicine

## 2018-11-18 NOTE — Telephone Encounter (Signed)
Called the pt and informed her of the message below.  Patient stated she will scan the results in, send via Mychart message and the lab appt was cancelled for 9/10.

## 2018-11-18 NOTE — Telephone Encounter (Signed)
Could she get me copy of labwork? I can check and let her know what other labwork she may be missing. Likely they hit the main points so we can probably hold off for now, but I would like to review before telling her not to complete. If she has appt for lab soon and can't get results then cancel lab appointment until I see results and call her with my opinion.

## 2018-11-18 NOTE — Telephone Encounter (Signed)
Copied from [place] [phone]. Topic: General - Other >> Nov 18, 2018 12:42 PM Keene Breath wrote: Reason for CRM: Patient called to ask the nurse if she would still have to get more lab work if she just had it done at [facility] on 11/10/18.  She does not think her insurance will cover it so soon again if it's the same blood work.  Please advise and call patient back to let her know.  CB# [phone]

## 2018-11-19 ENCOUNTER — Other Ambulatory Visit: Payer: PRIVATE HEALTH INSURANCE

## 2018-11-19 ENCOUNTER — Other Ambulatory Visit (INDEPENDENT_AMBULATORY_CARE_PROVIDER_SITE_OTHER): Payer: PRIVATE HEALTH INSURANCE

## 2018-11-19 ENCOUNTER — Other Ambulatory Visit: Payer: Self-pay

## 2018-11-19 DIAGNOSIS — R5383 Other fatigue: Secondary | ICD-10-CM | POA: Diagnosis not present

## 2018-11-19 DIAGNOSIS — L680 Hirsutism: Secondary | ICD-10-CM | POA: Diagnosis not present

## 2018-11-19 LAB — TESTOSTERONE: Testosterone: 37.32 ng/dL (ref 15.00–40.00)

## 2018-11-19 LAB — VITAMIN B12: Vitamin B-12: >1500 pg/mL — ABNORMAL HIGH (ref 211–911)

## 2018-11-20 LAB — DHEA-SULFATE: DHEA-SO4: 138 ug/dL (ref 18–391)

## 2018-11-30 ENCOUNTER — Encounter: Payer: Self-pay | Admitting: Gynecology

## 2018-12-20 ENCOUNTER — Other Ambulatory Visit: Payer: Self-pay | Admitting: Family Medicine

## 2019-01-08 ENCOUNTER — Telehealth (INDEPENDENT_AMBULATORY_CARE_PROVIDER_SITE_OTHER): Payer: PRIVATE HEALTH INSURANCE | Admitting: Family Medicine

## 2019-01-08 DIAGNOSIS — J069 Acute upper respiratory infection, unspecified: Secondary | ICD-10-CM | POA: Diagnosis not present

## 2019-01-08 NOTE — Progress Notes (Signed)
Virtual Visit via Video Note  I connected with@ on 01/08/19 at 11:30 AM EDT by a video enabled telemedicine application 2/2 [id number] pandemic and verified that I am speaking with the correct person using two identifiers.  Location patient: home Location provider:work or home office Persons participating in the virtual visit: patient, provider  I discussed the limitations of evaluation and management by telemedicine and the availability of in person appointments. The patient expressed understanding and agreed to proceed.   HPI: Pt is a 28 yo female with pmh sig for HTN, asthma, IBS, seasonall allergies, and depression followed by Dr. Ethlyn Gallery.  Pt seen for acute concern.  Pt endorses sore throat, thick mucus, nasal drainage, HAs, ear pressure and slight cough x 5 days.  Denies fever, chills, n/v, sick contacts.  Pt tired OTC sinus congestion med, Ibuprofen, mucinex.     ROS: See pertinent positives and negatives per HPI.  Past Medical History:  Diagnosis Date  . Allergy   . Asthma   . Blood in stool   . BMI 40.0-44.9, adult ([place]) 12/04/2015  . Depression   . History of chicken pox   . Hypertension   . IBS (irritable bowel syndrome)     No past surgical history on file.  Family History  Problem Relation Age of Onset  . Hypertension Father   . Heart disease Father   . Breast cancer Mother 61  . Hyperlipidemia Maternal Grandfather   . CVA Maternal Grandfather 33  . Hyperlipidemia Paternal Grandmother   . Hyperlipidemia Paternal Grandfather   . Stroke Paternal Grandfather 63  . High blood pressure Brother   . High Cholesterol Brother   . Other Maternal Grandmother        brain tumor; not cancer     Current Outpatient Medications:  .  cetirizine (ZYRTEC) 10 MG tablet, Take 10 mg by mouth daily., Disp: , Rfl:  .  FLOVENT HFA 44 MCG/ACT inhaler, TAKE 1 PUFF BY MOUTH TWICE A DAY, Disp: 10.6 Inhaler, Rfl: 2 .  fluticasone (FLONASE) 50 MCG/ACT nasal spray, Place into both  nostrils daily., Disp: , Rfl:  .  hydrochlorothiazide (HYDRODIURIL) 25 MG tablet, TAKE 1 TABLET BY MOUTH EVERY DAY, Disp: 90 tablet, Rfl: 1 .  irbesartan (AVAPRO) 75 MG tablet, TAKE 1 TABLET BY MOUTH EVERY DAY, Disp: 30 tablet, Rfl: 3 .  PROAIR HFA 108 (90 Base) MCG/ACT inhaler, INHALE 1 PUFF INTO THE LUNGS EVERY 6 (SIX) HOURS AS NEEDED FOR WHEEZING OR SHORTNESS OF BREATH., Disp: 8.5 g, Rfl: 1 .  triamcinolone ointment (KENALOG) 0.1 %, Apply 1 application topically 2 (two) times daily., Disp: 30 g, Rfl: 0  EXAM:  VITALS per patient if applicable:  RR between 12-20 bpm  GENERAL: alert, oriented, appears well and in no acute distress  HEENT: atraumatic, conjunctiva clear, no obvious abnormalities on inspection of external nose and ears  NECK: normal movements of the head and neck  LUNGS: on inspection no signs of respiratory distress, breathing rate appears normal, no obvious gross SOB, gasping or wheezing  CV: no obvious cyanosis  MS: moves all visible extremities without noticeable abnormality  PSYCH/NEURO: pleasant and cooperative, no obvious depression or anxiety, speech and thought processing grossly intact  ASSESSMENT AND PLAN:  Discussed the following assessment and plan:  Viral upper respiratory tract infection -supportive care -Discussed using Flonase BID, OTC cold meds with caution 2/2 h/o HTN, gargle with warm salt water, Tylenol, etc. -discussed f/u on Monday for continued or worsening symptoms -given precautions  F/u prn  I discussed the assessment and treatment plan with the patient. The patient was provided an opportunity to ask questions and all were answered. The patient agreed with the plan and demonstrated an understanding of the instructions.   The patient was advised to call back or seek an in-person evaluation if the symptoms worsen or if the condition fails to improve as anticipated.    Deeann Saint, MD

## 2019-01-27 ENCOUNTER — Encounter: Payer: PRIVATE HEALTH INSURANCE | Admitting: Family Medicine

## 2019-02-08 ENCOUNTER — Other Ambulatory Visit: Payer: Self-pay | Admitting: Family Medicine

## 2019-02-10 NOTE — Telephone Encounter (Signed)
Last OV 01/08/19  Last refill 07/07/18 #30/3 Next OV 05/03/19

## 2019-03-30 ENCOUNTER — Other Ambulatory Visit: Payer: Self-pay | Admitting: Family Medicine

## 2019-03-31 MED ORDER — TRIAMCINOLONE ACETONIDE 0.1 % EX OINT: 1.0000 "application " | TOPICAL_OINTMENT | Freq: Two times a day (BID) | CUTANEOUS | 2 refills | Status: DC

## 2019-04-30 ENCOUNTER — Other Ambulatory Visit: Payer: Self-pay

## 2019-05-03 ENCOUNTER — Other Ambulatory Visit: Payer: Self-pay

## 2019-05-03 ENCOUNTER — Ambulatory Visit (INDEPENDENT_AMBULATORY_CARE_PROVIDER_SITE_OTHER): Payer: 59 | Admitting: Family Medicine

## 2019-05-03 ENCOUNTER — Encounter: Payer: Self-pay | Admitting: Family Medicine

## 2019-05-03 VITALS — BP 100/80 | HR 115 | Temp 96.9°F | Ht 63.0 in | Wt 231.4 lb

## 2019-05-03 DIAGNOSIS — J4521 Mild intermittent asthma with (acute) exacerbation: Secondary | ICD-10-CM

## 2019-05-03 DIAGNOSIS — Z0000 Encounter for general adult medical examination without abnormal findings: Secondary | ICD-10-CM

## 2019-05-03 DIAGNOSIS — I10 Essential (primary) hypertension: Secondary | ICD-10-CM | POA: Diagnosis not present

## 2019-05-03 DIAGNOSIS — F419 Anxiety disorder, unspecified: Secondary | ICD-10-CM | POA: Diagnosis not present

## 2019-05-03 NOTE — Progress Notes (Signed)
Kristy Ross DOB: [birth year] Encounter date: 05/03/2019  This is a 29 y.o. female who presents for complete physical   History of present illness/Additional concerns: At last visit was working on looking at food differently more for nutrition. Highest she has weighed was 252. [employer] was too restrictive for her. Really trying to do more intuitive eating. Started therapy to help with emotional eating. Has done just 2 therapy sessions. First session states that she was dx with ocd. Recommended if open to it to start medication. Does have anxiety; with some pretty constant anxiety thoughts. Feels that mental health is better without food restriction. Has been on and off antidepressants/anti-anxiety medications since teenager. Tried zoloft ([age]) and prozac ([age]). Had put on a lot of weight with zoloft. Also was gaining weight with prozac. zoloft helped with depression, but then when went to college developed social anxiety and prozac made her feel like a walking zombie. Would like to have something to help with brain and not having such obsessive thoughts. Suggested considering Viibryd. No attention issues. Sleep is not affected. Has noticed that awareness of hunger cues has heightened. Feels like she is connecting more with body at this point. If stressful then disconnects and eats out of comfort.   Last pap was 11/27/17 and was normal.   HTN: irbesartan; checks occasionally.   Asthma mild intermittent: flovent, proair prn. Breathing has been good. Daily steroid has been Secretary/administrator for her. Doesn't use the proair at all. Air purifier has helped.   Allergies:zyrtec, flonase. Well controlled.   IBS: diarrhea type. Stools have been better. Eating more whole grains which has helped with stools. Really only going 1-2x/day.   Past Medical History:  Diagnosis Date  . Allergy   . Asthma   . Blood in stool   . BMI 40.0-44.9, adult ([facility]) 12/04/2015  . Depression   . History of chicken pox    . Hypertension   . IBS (irritable bowel syndrome)    History reviewed. No pertinent surgical history. Allergies  Allergen Reactions  . Bee Venom Anaphylaxis  . Niacin And Related Hives   Current Meds  Medication Sig  . cetirizine (ZYRTEC) 10 MG tablet Take 10 mg by mouth daily.  Marland Kitchen FLOVENT HFA 44 MCG/ACT inhaler TAKE 1 PUFF BY MOUTH TWICE A DAY  . fluticasone (FLONASE) 50 MCG/ACT nasal spray Place into both nostrils as needed.   . hydrochlorothiazide (HYDRODIURIL) 25 MG tablet TAKE 1 TABLET BY MOUTH EVERY DAY  . irbesartan (AVAPRO) 75 MG tablet TAKE 1 TABLET BY MOUTH EVERY DAY  . PROAIR HFA 108 (90 Base) MCG/ACT inhaler INHALE 1 PUFF INTO THE LUNGS EVERY 6 (SIX) HOURS AS NEEDED FOR WHEEZING OR SHORTNESS OF BREATH.  . triamcinolone ointment (KENALOG) 0.1 % Apply 1 application topically 2 (two) times daily.   Social History   Tobacco Use  . Smoking status: Never Smoker  . Smokeless tobacco: Never Used  Substance Use Topics  . Alcohol use: No    Alcohol/week: 0.0 standard drinks    Comment: rare   Family History  Problem Relation Age of Onset  . Hypertension Father   . Heart disease Father   . Breast cancer Mother 96  . Hyperlipidemia Maternal Grandfather   . CVA Maternal Grandfather 27  . Hyperlipidemia Paternal Grandmother   . Hyperlipidemia Paternal Grandfather   . Stroke Paternal Grandfather 53  . High blood pressure Brother   . High Cholesterol Brother   . Other Maternal Grandmother  brain tumor; not cancer     Review of Systems  Constitutional: Negative for activity change, appetite change, chills, fatigue, fever and unexpected weight change.  HENT: Negative for congestion, ear pain, hearing loss, sinus pressure, sinus pain, sore throat and trouble swallowing.   Eyes: Negative for pain and visual disturbance.  Respiratory: Negative for cough, chest tightness, shortness of breath and wheezing.   Cardiovascular: Negative for chest pain, palpitations and leg  swelling.  Gastrointestinal: Negative for abdominal pain, blood in stool, constipation, diarrhea, nausea and vomiting.  Genitourinary: Negative for difficulty urinating and menstrual problem.  Musculoskeletal: Negative for arthralgias and back pain.  Skin: Negative for rash.  Neurological: Negative for dizziness, weakness, numbness and headaches.  Hematological: Negative for adenopathy. Does not bruise/bleed easily.  Psychiatric/Behavioral: Positive for self-injury. Negative for sleep disturbance and suicidal ideas. The patient is not nervous/anxious.     CBC:  Lab Results  Component Value Date   WBC 7.6 03/30/2018   HGB 14.9 03/30/2018   HCT 43.5 03/30/2018   MCHC 34.2 03/30/2018   RDW 14.0 03/30/2018   PLT 368.0 03/30/2018   CMP: Lab Results  Component Value Date   NA 140 03/30/2018   K 4.4 03/30/2018   CL 102 03/30/2018   CO2 27 03/30/2018   GLUCOSE 84 03/30/2018   BUN 12 03/30/2018   CREATININE 0.87 03/30/2018   CALCIUM 9.5 03/30/2018   LIPID: Lab Results  Component Value Date   CHOL 160 03/30/2018   TRIG 129.0 03/30/2018   HDL 45.50 03/30/2018   LDLCALC 89 03/30/2018    Objective:  BP 100/80 (BP Location: Left Arm, Patient Position: Sitting, Cuff Size: Large)   Pulse (!) 115   Temp (!) 96.9 F (36.1 C) (Temporal)   Ht 5\' 3"  (1.6 m)   Wt 231 lb 6.4 oz (105 kg)   LMP 04/12/2019 (Approximate)   SpO2 98%   BMI 40.99 kg/m   Weight: 231 lb 6.4 oz (105 kg)   BP Readings from Last 3 Encounters:  05/03/19 100/80  11/06/18 128/89  04/16/18 110/80   Wt Readings from Last 3 Encounters:  05/03/19 231 lb 6.4 oz (105 kg)  11/06/18 243 lb (110.2 kg)  04/16/18 248 lb 14.4 oz (112.9 kg)    Physical Exam Constitutional:      General: She is not in acute distress.    Appearance: She is well-developed.  HENT:     Head: Normocephalic and atraumatic.     Right Ear: External ear normal.     Left Ear: External ear normal.     Mouth/Throat:     Pharynx: No  oropharyngeal exudate.  Eyes:     Conjunctiva/sclera: Conjunctivae normal.     Pupils: Pupils are equal, round, and reactive to light.  Neck:     Thyroid: No thyromegaly.  Cardiovascular:     Rate and Rhythm: Normal rate and regular rhythm.     Heart sounds: Normal heart sounds. No murmur. No friction rub. No gallop.   Pulmonary:     Effort: Pulmonary effort is normal.     Breath sounds: Normal breath sounds.  Abdominal:     General: Bowel sounds are normal. There is no distension.     Palpations: Abdomen is soft. There is no mass.     Tenderness: There is no abdominal tenderness. There is no guarding.     Hernia: No hernia is present.  Musculoskeletal:        General: No tenderness or deformity. Normal range  of motion.     Cervical back: Normal range of motion and neck supple.  Lymphadenopathy:     Cervical: No cervical adenopathy.  Skin:    General: Skin is warm and dry.     Findings: No rash.     Comments: Dark mole upper center abdomen stable per patient, please 3 mm in diameter.  Left side abdomen 3 and half millimeter linear mole, stable per patient.  Scattered freckles and moles over torso and extremities.  Neurological:     Mental Status: She is alert and oriented to person, place, and time.     Deep Tendon Reflexes: Reflexes normal.     Reflex Scores:      Tricep reflexes are 2+ on the right side and 2+ on the left side.      Bicep reflexes are 2+ on the right side and 2+ on the left side.      Brachioradialis reflexes are 2+ on the right side and 2+ on the left side.      Patellar reflexes are 2+ on the right side and 2+ on the left side. Psychiatric:        Speech: Speech normal.        Behavior: Behavior normal.        Thought Content: Thought content normal.    GAD 7 : Generalized Anxiety Score 05/03/2019  Nervous, Anxious, on Edge 2  Control/stop worrying 3  Worry too much - different things 3  Trouble relaxing 3  Restless 2  Easily annoyed or irritable 3   Afraid - awful might happen 3  Total GAD 7 Score 19  Anxiety Difficulty Not difficult at all     Assessment/Plan: There are no preventive care reminders to display for this patient. Health Maintenance reviewed.  1. Preventative health care We discussed her most pressing concern for overall health today which is obtaining a healthy relationship with food.  She would like to get to the point where she is eating just to nourish her body into decrease or eliminate mindless eating.  She is working with a therapist to help with this.  We will plan close follow-up to help with health goals.  She would like to get to a healthier weight overall, but feels that she needs to approach food from healthier manner first.  2. Essential hypertension Blood pressure is well controlled today.  Continue with healthy eating.  3. Mild intermittent asthma with acute exacerbation Well-controlled.  Continue with Flovent inhaler.  4. Anxiety We had an extensive discussion about anxiety today.  Patient's therapist recommended Viibryd, but as we discussed this is medication meds more used for treating major depression.  I am not sure if she has seen specific benefits of this medication as it pertains to anxiety or intrusive thinking, which the patient complains of.  Although she does have significant daily anxiety, her overall functioning and sleep as well as relationships do not seem to be impaired by this.  She is concerned about side effects with SSRIs, having taken 2 of them in the past (weight gain, apathy).  After discussion, we agreed that it is reasonable to wait another month and continue with therapy and tools for anxiety management.  I feel she is able to gain some confidence regarding her ability to control anxiety, she will feel better overall.  We discussed consideration for BuSpar to help with anxiety, but unfortunately is not a very potent anxiolytic and I do not feel that it would help with  her more  intrusive thinking pattern.  I would certainly try this before an SSRI with her though, especially with concern for weight gain.  Return in about 1 month (around 05/31/2019) for virtual visit 30 minutes.  Micheline Rough, MD

## 2019-05-19 ENCOUNTER — Other Ambulatory Visit: Payer: Self-pay | Admitting: Family Medicine

## 2019-06-02 ENCOUNTER — Telehealth: Payer: 59 | Admitting: Family Medicine

## 2019-06-04 ENCOUNTER — Encounter: Payer: Self-pay | Admitting: Family Medicine

## 2019-06-04 ENCOUNTER — Telehealth (INDEPENDENT_AMBULATORY_CARE_PROVIDER_SITE_OTHER): Payer: 59 | Admitting: Family Medicine

## 2019-06-04 ENCOUNTER — Other Ambulatory Visit: Payer: Self-pay

## 2019-06-04 VITALS — BP 110/70

## 2019-06-04 DIAGNOSIS — I10 Essential (primary) hypertension: Secondary | ICD-10-CM | POA: Diagnosis not present

## 2019-06-04 DIAGNOSIS — Z6841 Body Mass Index (BMI) 40.0 and over, adult: Secondary | ICD-10-CM

## 2019-06-04 DIAGNOSIS — F419 Anxiety disorder, unspecified: Secondary | ICD-10-CM

## 2019-06-04 NOTE — Progress Notes (Signed)
Virtual Visit via Video Note  I connected with Kristy Ross  on 06/04/19 at  8:00 AM EDT by a video enabled telemedicine application and verified that I am speaking with the correct person using two identifiers.  Location patient: home Location provider:work or home office Persons participating in the virtual visit: patient, provider  I discussed the limitations of evaluation and management by telemedicine and the availability of in person appointments. The patient expressed understanding and agreed to proceed.   Kristy Ross DOB: 07-Dec-1990 Encounter date: 06/04/2019  This is a 29 y.o. female who presents with Chief Complaint  Patient presents with  . Follow-up    History of present illness: Things have been going well in last month. Has felt that therapy has helped with mindfulness, grounding self, anxiety techniques. Feels that she is working through stressors better at this time and really does not feel that there is need for medication at this time.   Was going to [facility], then switched to intuitive eating. Wants to loose weight to be healthy, but also work on relationship with food. Working on replacing foods with more vegetables and fruits. Feels like she is doing better with watching calories rather than doing point system.   Last week was really good about exercise. This week just exhausted and really busy at work and didn't fit it in this week.    Allergies  Allergen Reactions  . Bee Venom Anaphylaxis  . Niacin And Related Hives   Current Meds  Medication Sig  . cetirizine (ZYRTEC) 10 MG tablet Take 10 mg by mouth daily.  Marland Kitchen FLOVENT HFA 44 MCG/ACT inhaler TAKE 1 PUFF BY MOUTH TWICE A DAY  . fluticasone (FLONASE) 50 MCG/ACT nasal spray Place into both nostrils as needed.   . hydrochlorothiazide (HYDRODIURIL) 25 MG tablet TAKE 1 TABLET BY MOUTH EVERY DAY  . irbesartan (AVAPRO) 75 MG tablet TAKE 1 TABLET BY MOUTH EVERY DAY  . PROAIR HFA 108 (90 Base) MCG/ACT inhaler  INHALE 1 PUFF INTO THE LUNGS EVERY 6 (SIX) HOURS AS NEEDED FOR WHEEZING OR SHORTNESS OF BREATH.  . triamcinolone ointment (KENALOG) 0.1 % Apply 1 application topically 2 (two) times daily.    Review of Systems  Constitutional: Negative for chills, fatigue and fever.  Respiratory: Negative for cough, chest tightness, shortness of breath and wheezing.   Cardiovascular: Negative for chest pain, palpitations and leg swelling.    Objective:  There were no vitals taken for this visit.      BP Readings from Last 3 Encounters:  05/03/19 100/80  11/06/18 128/89  04/16/18 110/80   Wt Readings from Last 3 Encounters:  05/03/19 231 lb 6.4 oz (105 kg)  11/06/18 243 lb (110.2 kg)  04/16/18 248 lb 14.4 oz (112.9 kg)    EXAM:  GENERAL: alert, oriented, appears well and in no acute distress  HEENT: atraumatic, conjunctiva clear, no obvious abnormalities on inspection of external nose and ears  NECK: normal movements of the head and neck  LUNGS: on inspection no signs of respiratory distress, breathing rate appears normal, no obvious gross SOB, gasping or wheezing  CV: no obvious cyanosis  MS: moves all visible extremities without noticeable abnormality  PSYCH/NEURO: pleasant and cooperative, no obvious depression or anxiety, speech and thought processing grossly intact   Assessment/Plan   1. Essential hypertension Well controlled. Continue to monitor as she loses weight.  2. BMI 40.0-44.9, adult [facility]) She is losing weight currently. Really focused on mindfulness. We discussed fitting in exercise  to regular routine. Recommended beach body for exercise at home.  3. Anxiety She is doing much better with regular therapy techniques, mindfulness.     I discussed the assessment and treatment plan with the patient. The patient was provided an opportunity to ask questions and all were answered. The patient agreed with the plan and demonstrated an understanding of the instructions.    The patient was advised to call back or seek an in-person evaluation if the symptoms worsen or if the condition fails to improve as anticipated.  I provided 20 minutes of non-face-to-face time during this encounter.   Micheline Rough, MD

## 2019-06-08 ENCOUNTER — Telehealth: Payer: Self-pay | Admitting: *Deleted

## 2019-06-08 NOTE — Telephone Encounter (Signed)
-----   Message from Wynn Banker, MD sent at 06/04/2019  8:22 AM EDT ----- 3 month CCV please (virtual is ok if desired)

## 2019-06-08 NOTE — Telephone Encounter (Signed)
Spoke with the pt and scheduled a virtual visit for 6/30 at 1pm.

## 2019-07-22 ENCOUNTER — Other Ambulatory Visit: Payer: Self-pay | Admitting: Family Medicine

## 2019-08-26 ENCOUNTER — Other Ambulatory Visit: Payer: Self-pay | Admitting: Family Medicine

## 2019-09-08 ENCOUNTER — Telehealth (INDEPENDENT_AMBULATORY_CARE_PROVIDER_SITE_OTHER): Payer: 59 | Admitting: Family Medicine

## 2019-09-08 ENCOUNTER — Encounter: Payer: Self-pay | Admitting: Family Medicine

## 2019-09-08 DIAGNOSIS — F419 Anxiety disorder, unspecified: Secondary | ICD-10-CM

## 2019-09-08 DIAGNOSIS — J302 Other seasonal allergic rhinitis: Secondary | ICD-10-CM | POA: Diagnosis not present

## 2019-09-08 DIAGNOSIS — J4521 Mild intermittent asthma with (acute) exacerbation: Secondary | ICD-10-CM | POA: Diagnosis not present

## 2019-09-08 DIAGNOSIS — I10 Essential (primary) hypertension: Secondary | ICD-10-CM

## 2019-09-08 DIAGNOSIS — L309 Dermatitis, unspecified: Secondary | ICD-10-CM | POA: Insufficient documentation

## 2019-09-08 DIAGNOSIS — K582 Mixed irritable bowel syndrome: Secondary | ICD-10-CM

## 2019-09-08 DIAGNOSIS — Z1159 Encounter for screening for other viral diseases: Secondary | ICD-10-CM

## 2019-09-08 MED ORDER — PROAIR HFA 108 (90 BASE) MCG/ACT IN AERS: INHALATION_SPRAY | RESPIRATORY_TRACT | 1 refills | Status: DC

## 2019-09-08 NOTE — Progress Notes (Signed)
Virtual Visit via Video Note Video connection was not working so we completed visit on phone  I connected with Kristy Ross on 09/08/19 at  1:00 PM EDT by a video enabled telemedicine application and verified that I am speaking with the correct person using two identifiers.  Location patient: home Location provider:work or home office Persons participating in the virtual visit: patient, provider  I discussed the limitations of evaluation and management by telemedicine and the availability of in person appointments. The patient expressed understanding and agreed to proceed.   Kristy Ross DOB: 03/21/1990 Encounter date: 09/08/2019  This is a 29 y.o. female who presents with Chief Complaint  Patient presents with   Follow-up    History of present illness: Things have been good for her. Therapy has been so good for her. Has been working through anxiety a lot.   Physically she is doing well, making healthier choices (doing small things) Walking and doing strength training. Doing some pinterest or videos. Usually does this while watching TV.   WTU:[id number] 75mg  daily, hctz 25mg  daily: 114/75 and HR 78. Last weight 2 weeks ago was 228.   Anxiety: has helped her to have someone to express fears out loud and work through these (ie like driving) and tapping technique. Also working on meditation - really helped with grounding. Also just recognizing ability to control fevers, obsessive thoughts.   Asthma: doing well with one puff flovent daily. Air purifier has helped with allergies in house. Doesn't remember when she used last proair inhaler.  First stomach hurts, then brain fog, then chest pressure, then cramping; feels like body shuts down - lasts 5-10 minutes. Sometimes constipated, sometimes diarrhea. Notes for sure with whey protein and with avocado. These issues have been going on for years. Randomly will have blood in stool. Had colonoscopy when 18 that was clear. Was having same sx at  that time.     Allergies  Allergen Reactions   Bee Venom Anaphylaxis   Niacin And Related Hives   Current Meds  Medication Sig   cetirizine (ZYRTEC) 10 MG tablet Take 10 mg by mouth daily.   FLOVENT HFA 44 MCG/ACT inhaler TAKE 1 PUFF BY MOUTH TWICE A DAY   fluticasone (FLONASE) 50 MCG/ACT nasal spray Place into both nostrils as needed.    hydrochlorothiazide (HYDRODIURIL) 25 MG tablet TAKE 1 TABLET BY MOUTH EVERY DAY   irbesartan (AVAPRO) 75 MG tablet TAKE 1 TABLET BY MOUTH EVERY DAY   PROAIR HFA 108 (90 Base) MCG/ACT inhaler INHALE 1 PUFF INTO THE LUNGS EVERY 6 (SIX) HOURS AS NEEDED FOR WHEEZING OR SHORTNESS OF BREATH.   triamcinolone ointment (KENALOG) 0.1 % Apply 1 application topically 2 (two) times daily.    Review of Systems  Constitutional: Negative for chills, fatigue and fever.  Respiratory: Negative for cough, chest tightness, shortness of breath and wheezing.   Cardiovascular: Negative for chest pain, palpitations and leg swelling.  Gastrointestinal: Positive for abdominal pain, blood in stool, constipation and diarrhea. Negative for nausea and vomiting.    Objective:  There were no vitals taken for this visit.      BP Readings from Last 3 Encounters:  06/04/19 110/70  05/03/19 100/80  11/06/18 128/89   Wt Readings from Last 3 Encounters:  05/03/19 231 lb 6.4 oz (105 kg)  11/06/18 243 lb (110.2 kg)  04/16/18 248 lb 14.4 oz (112.9 kg)    EXAM:  GENERAL: alert, oriented, appears well and in no acute distress   LUNGS: on  inspection no signs of respiratory distress, breathing rate appears normal, no obvious gross SOB, gasping or wheezing   PSYCH/NEURO: pleasant and cooperative, no obvious depression or anxiety, speech and thought processing grossly intact   Assessment/Plan  1. Essential hypertension Has been well controlled. Continue current medications.  - CBC with Differential/Platelet; Future - Comprehensive metabolic panel; Future  2.  Mild intermittent asthma with acute exacerbation Stable. Continue with flovent. Albuterol sent in since she no longer has rescue inhaler at home.  3. Seasonal allergies Improved at home with air purifier. Stable.  4. Anxiety Much improved with therapy and mindfulness!  5. Eczema, unspecified type Uncertain if there is food trigger. She has hard time healing up eczema, even with steroid creams. She has targeted a couple of problematic foods, but we are going to test a little more and consider derm referral as well for future.   6. Irritable bowel syndrome with both constipation and diarrhea See above. Has been going on for years.  - Celiac Panel 10; Future - Allergen, Beta-lactoglob,f77; Future - Allergen Avocado f96; Future  7. Encounter for hepatitis C screening test for low risk patient - Hepatitis C antibody; Future  Return for pending bloodwork; likely 6 months for physical.    I discussed the assessment and treatment plan with the patient. The patient was provided an opportunity to ask questions and all were answered. The patient agreed with the plan and demonstrated an understanding of the instructions.   The patient was advised to call back or seek an in-person evaluation if the symptoms worsen or if the condition fails to improve as anticipated.  I provided 25 minutes of non-face-to-face time during this encounter.   Kristy Shove, MD

## 2019-09-09 ENCOUNTER — Other Ambulatory Visit: Payer: Self-pay

## 2019-09-09 ENCOUNTER — Telehealth: Payer: Self-pay | Admitting: *Deleted

## 2019-09-09 ENCOUNTER — Other Ambulatory Visit (INDEPENDENT_AMBULATORY_CARE_PROVIDER_SITE_OTHER): Payer: 59

## 2019-09-09 DIAGNOSIS — Z1159 Encounter for screening for other viral diseases: Secondary | ICD-10-CM

## 2019-09-09 DIAGNOSIS — K582 Mixed irritable bowel syndrome: Secondary | ICD-10-CM | POA: Diagnosis not present

## 2019-09-09 DIAGNOSIS — I10 Essential (primary) hypertension: Secondary | ICD-10-CM

## 2019-09-09 LAB — COMPREHENSIVE METABOLIC PANEL
ALT: 13 U/L (ref 0–35)
AST: 17 U/L (ref 0–37)
Albumin: 4.2 g/dL (ref 3.5–5.2)
Alkaline Phosphatase: 68 U/L (ref 39–117)
BUN: 14 mg/dL (ref 6–23)
CO2: 27 mEq/L (ref 19–32)
Calcium: 9.4 mg/dL (ref 8.4–10.5)
Chloride: 99 mEq/L (ref 96–112)
Creatinine, Ser: 0.76 mg/dL (ref 0.40–1.20)
GFR: 89.15 mL/min (ref >60.00)
Glucose, Bld: 87 mg/dL (ref 70–99)
Potassium: 3.8 mEq/L (ref 3.5–5.1)
Sodium: 134 mEq/L — ABNORMAL LOW (ref 135–145)
Total Bilirubin: 0.5 mg/dL (ref 0.2–1.2)
Total Protein: 7 g/dL (ref 6.0–8.3)

## 2019-09-09 LAB — CBC WITH DIFFERENTIAL/PLATELET
Basophils Absolute: 0.1 10*3/uL (ref 0.0–0.1)
Basophils Relative: 1.2 % (ref 0.0–3.0)
Eosinophils Absolute: 0.5 10*3/uL (ref 0.0–0.7)
Eosinophils Relative: 6.4 % — ABNORMAL HIGH (ref 0.0–5.0)
HCT: 42.3 % (ref 36.0–46.0)
Hemoglobin: 14.7 g/dL (ref 12.0–15.0)
Lymphocytes Relative: 26.8 % (ref 12.0–46.0)
Lymphs Abs: 2.1 10*3/uL (ref 0.7–4.0)
MCHC: 34.8 g/dL (ref 30.0–36.0)
MCV: 93 fl (ref 78.0–100.0)
Monocytes Absolute: 0.6 10*3/uL (ref 0.1–1.0)
Monocytes Relative: 7.6 % (ref 3.0–12.0)
Neutro Abs: 4.5 10*3/uL (ref 1.4–7.7)
Neutrophils Relative %: 58 % (ref 43.0–77.0)
Platelets: 336 10*3/uL (ref 150.0–400.0)
RBC: 4.54 Mil/uL (ref 3.87–5.11)
RDW: 12.7 % (ref 11.5–15.5)
WBC: 7.7 10*3/uL (ref 4.0–10.5)

## 2019-09-09 NOTE — Telephone Encounter (Signed)
-----   Message from Wynn Banker, MD sent at 09/08/2019  1:43 PM EDT ----- I have ordered labwork on her; please set up appt at her convenience. thanks

## 2019-09-09 NOTE — Telephone Encounter (Signed)
Left a detailed message at the pts cell number to call for a lab appt as below.  °

## 2019-09-10 LAB — ALLERGEN AVOCADO F96
Allergen Avocado f96: 3.25 kU/L — ABNORMAL HIGH
CLASS: 2

## 2019-09-10 LAB — ALLERGEN, BETA-LACTOGLOB,F77: Allergen, Beta-lactoglob,f77: 0.25 kU/L — ABNORMAL HIGH

## 2019-09-10 LAB — INTERPRETATION:

## 2019-09-10 LAB — HEPATITIS C ANTIBODY
Hepatitis C Ab: NONREACTIVE
SIGNAL TO CUT-OFF: 0.04 (ref <1.00)

## 2019-09-11 LAB — CELIAC PANEL 10
Antigliadin Abs, IgA: 6 units (ref 0–19)
Endomysial IgA: NEGATIVE
Gliadin IgG: 3 units (ref 0–19)
IgA/Immunoglobulin A, Serum: 274 mg/dL (ref 87–352)
Tissue Transglut Ab: 3 U/mL (ref 0–5)
Transglutaminase IgA: <2 U/mL (ref 0–3)

## 2019-09-15 ENCOUNTER — Other Ambulatory Visit: Payer: Self-pay | Admitting: Family Medicine

## 2019-09-15 DIAGNOSIS — Z91018 Allergy to other foods: Secondary | ICD-10-CM

## 2019-09-15 NOTE — Progress Notes (Signed)
Order placed

## 2019-10-13 ENCOUNTER — Other Ambulatory Visit: Payer: Self-pay | Admitting: Family Medicine

## 2019-10-22 ENCOUNTER — Other Ambulatory Visit: Payer: Self-pay

## 2019-10-22 ENCOUNTER — Encounter: Payer: Self-pay | Admitting: Dietician

## 2019-10-22 ENCOUNTER — Encounter: Payer: 59 | Attending: Family Medicine | Admitting: Dietician

## 2019-10-22 DIAGNOSIS — Z91018 Allergy to other foods: Secondary | ICD-10-CM | POA: Diagnosis present

## 2019-10-22 NOTE — Patient Instructions (Addendum)
Vitamin D 1-2,000 IU per day Vitamin K  Is ok to supplement  Have a Gatorade Zero throughout the day.  Take a detailed food log of what you eat, when you eat it, the symptoms and when they arise.  Work on low FODMAP dietary planning. Begin recognizing FODMAP foods and how they sit with you.  Replace carb balance tortillas for a few days and see how your symptoms change.

## 2019-10-22 NOTE — Progress Notes (Signed)
Medical Nutrition Therapy:  Appt start time: 0915 end time:  1020.  Pt reports eliminating 16 food items, some contained latex and some not. Pt noticed improvements doing this initially, but symptoms have begun to occur again.  Pt reports rash developing around her lips, lip swelling, and eczema flare ups on her hands. Pt also reports flu like symptoms (feverish, weak, chills, lack of awareness) and terrible stomach pain, chest pressure. Pt reports history of GERD, and can recognize the symptoms. Pt reports irritability in her GI system when consuming dairy. States she has a history of IBS that was diagnosed at age 29. Pt reports very bad symptoms, and that she believes that stress contributed to the development of IBS.  Pt reports going vegetarian for 3 years in college. Pt reports having loose stools a couple of times a day, and this is fairly normal since her IBS diagnosis 15 years ago. Pt reports trying dairy alternatives and thinks they may also contribute to symptoms. Pt reports thinking almonds/almond milk may cause a reaction. 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 Pt reports playing tennis regularly.  Pt is reading intuitive eating book and watching a YouTube series about intuitive eating.  Assessment:  Primary concerns today: Food allergies.   Preferred Learning Style:   No preference indicated   Learning Readiness:   Ready    MEDICATIONS: Hydrochlorothiazide, Avapro   DIETARY INTAKE:  Usual eating pattern includes 3 meals and 1 snacks per day.  Everyday foods include eggs, spinach, and onions, mushrooms.  Avoided foods include avocado, dairy.    24-hr recall:  B ( AM): 1 egg omelette, spinach, onion, portabello, hot sauce, everything but bagel seasoning. 2 6" carb smart tortillas. 20 oz. Cold brew with oatmilk and caramel flavor creamer.  Snk ( AM): none  L ( PM): 2 extreme wellness wraps, oilive oil, seasoning, chicken sausage, onion and spinach, fake mozzarella. (pizzas)  non-dairy ranch ALLERGIC REACTION Snk ( PM): 8 oz cold brew coffee with oatmilk and caramel creamer D ( PM): Peanut butter sandwich on daves killer multigrain bread Snk ( PM): none  Beverages: water, coffee  Usual physical activity: walks when she is feeling well, lifts weights when watching TV, tennis   Progress Towards Goal(s):  In progress.   Nutritional Diagnosis:  NB-1.1 Food and nutrition-related knowledge deficit As related to food allergies.  As evidenced by persistent GI discomfort and recurring episodes of food allergy symptoms.    Intervention:  Nutrition Education.  Counsel patient on the difficulty of living with a food allergy and the persistent symptoms. Assure patient that the focus of MNT is to address and work to relieve her symptoms. Educate patient on the potential role of certain dietary fibers in IBS and GI distress. Educate patient of FODMAP foods and food elimination technique. Recommend patient begin to take a detailed food and symptom log to help better identify potential causes of allergic response and/or GI distress. Reinforce the positive effect of intuitive eating on patient's health. Counsel patient on focusing on the totality of health, including mental and emotional health. Educate patient on the potential effects of prolonged diarrhea, and what nutrients need to be replenished. Encourage patient to listen to their body and to no not be concerned with weight or calories.  Teaching Method Utilized:  Visual Auditory   Handouts given during visit include:  Low FODMAP Nutrition Education NCM  Milk Allergy Nutrition Education NCM  Barriers to learning/adherence to lifestyle change: None  Demonstrated degree of understanding via:  Teach Back   Monitoring/Evaluation:  Dietary intake and allergy symptoms log in 1 month(s).

## 2019-11-17 ENCOUNTER — Encounter: Payer: Self-pay | Admitting: Family Medicine

## 2019-11-20 MED ORDER — FLUOXETINE HCL 10 MG PO CAPS
10.0000 mg | ORAL_CAPSULE | Freq: Every day | ORAL | 1 refills | Status: DC
Start: 2019-11-20 — End: 2020-05-22

## 2019-11-25 ENCOUNTER — Ambulatory Visit: Payer: 59 | Admitting: Dietician

## 2019-12-05 ENCOUNTER — Other Ambulatory Visit: Payer: Self-pay | Admitting: Family Medicine

## 2019-12-10 ENCOUNTER — Encounter: Payer: Self-pay | Admitting: Dietician

## 2019-12-10 ENCOUNTER — Encounter: Payer: 59 | Attending: Family Medicine | Admitting: Dietician

## 2019-12-10 ENCOUNTER — Other Ambulatory Visit: Payer: Self-pay

## 2019-12-10 DIAGNOSIS — T781XXD Other adverse food reactions, not elsewhere classified, subsequent encounter: Secondary | ICD-10-CM | POA: Diagnosis not present

## 2019-12-10 DIAGNOSIS — Z6841 Body Mass Index (BMI) 40.0 and over, adult: Secondary | ICD-10-CM | POA: Diagnosis present

## 2019-12-10 NOTE — Patient Instructions (Addendum)
Stick with [employer]  Make your afternoon coffee 4-6 oz   Switch to soy milk, or the Silk almond&Cashew milk with protein from oatmilk   Continue to work towards getting balance in your meals.  Green light on all vegetables that do not cause GI discomfort!!  Go for 30 minute walks 3 times a week in the mornings, or on lunch break!  [email or web address] for any questions!

## 2019-12-10 NOTE — Progress Notes (Signed)
Medical Nutrition Therapy:  Appt start time: 0840 end time:  0920.  Pt reports a little GI discomfort today, and believes it is due to her dinner last night (See 24 hr. Recall) Reports sourdough and whole wheat bread cause GI discomfort, Ezekiel Bread does not. Symptoms include bloating and gas.  Pt brought food and symptom log to appointment. Reports tracking her food, but had interruptions in tracking due to mental distress. Pt reports bouts of binge eating associated with stress/mental anguish/feeling overwhelmed. Reports being so overwhelmed that she wont eat sometimes. States that there is so much information out there that she does not know what to do. Pt states that she drinks too much coffee, and that it causes gas and diarrhea. Her 3 pm coffee causes her heart to race. Reducing her onion intake decreased cramping/discomfort. Changing from high fiber tortillas to regular tortillas has also helped to reduce symptoms. Pt reports being "black and white" about her nutrition, and needs to know exactly what to do.   Assessment:  Primary concerns today: Food allergies/GI distress.   Preferred Learning Style:   No preference indicated   Learning Readiness:   Ready   MEDICATIONS: Hydrochlorothiazide, Avapro, NEW: Prozac   DIETARY INTAKE:  Usual eating pattern includes 3 meals and 1 snacks per day.  Everyday foods include eggs, spinach, and onions, mushrooms.  Avoided foods include avocado, dairy. Spicy foods    24-hr recall:  B ( AM): Smoothie with frozen mixed berries, pea protein, powdered PB2, oat milk, ice, water, coffee with oatmilk creamer Snk ( AM):  Handful of mixed nuts L ( PM): White rice, salmon, siracha and mayonnaise, with seaweed Snk ( PM): Coffee with oatmilk creamer, ~16 oz D ( PM): 3 eggs with cashew queso, two tortillas, and hot sauce. Snk ( PM):   Beverages: water, coffee with oatmilk and flavored creamer.  Usual physical activity: Still playing tennis once a  week, walks occasionally   Progress Towards Goal(s):  In progress.   Nutritional Diagnosis:  NB-1.1 Food and nutrition-related knowledge deficit As related to food allergies.  As evidenced by persistent GI discomfort and recurring episodes of food allergy symptoms.    Intervention:  Nutrition Education.  Counsel patient on the difficulty of living with a food allergy and the persistent symptoms. Assure patient that the focus of MNT is to address and work to relieve her symptoms. Educate patient on the potential role of certain dietary fibers in IBS and GI distress. Recommend patient continue to take a detailed food and symptom log to help better identify potential causes of allergic response and/or GI distress. Reinforce the positive effect of intuitive eating on patient's health. Counsel patient on focusing on the totality of health, including mental and emotional health. Educate patient on the potential impact of her new medication on her appetite and weight. Encourage patient to trust in her dietitian as a source of accurate, evidence-based nutrition information. Educate patient on the balanced plate eating model, and how it can positively affect her health.  Goals:   Stick with [employer]  Make your afternoon coffee 4-6 oz   Switch to soy milk, or the Silk almond&Cashew milk with protein from oatmilk   Continue to work towards getting balance in your meals.  Green light on all vegetables that do not cause GI discomfort!!  Go for 30 minute walks 3 times a week in the mornings, or on lunch break!  [email or web address] for any questions!  Teaching Method Utilized:  Scientific laboratory technician  Handouts given:  MyPlate   Barriers to learning/adherence to lifestyle change: None  Demonstrated degree of understanding via:  Teach Back   Monitoring/Evaluation:  Dietary intake, GI symptoms, and changes in appetite  log in 1 month(s).

## 2020-01-13 ENCOUNTER — Encounter: Payer: Self-pay | Admitting: Dietician

## 2020-01-13 ENCOUNTER — Other Ambulatory Visit: Payer: Self-pay

## 2020-01-13 ENCOUNTER — Encounter: Payer: 59 | Attending: Family Medicine | Admitting: Dietician

## 2020-01-13 DIAGNOSIS — Z91018 Allergy to other foods: Secondary | ICD-10-CM | POA: Insufficient documentation

## 2020-01-13 DIAGNOSIS — Z713 Dietary counseling and surveillance: Secondary | ICD-10-CM | POA: Insufficient documentation

## 2020-01-13 DIAGNOSIS — Z6841 Body Mass Index (BMI) 40.0 and over, adult: Secondary | ICD-10-CM

## 2020-01-13 NOTE — Patient Instructions (Addendum)
Goals:   Stick with [employer]  Have some individually wrapped chocolates/snacks for cravings. Use small utensils when eating craving foods.  Avoid foods containing inulin.  Try soy yogurt!  Try Pumpkin spice hummus.  Take a log of food/stress on days of eczema flare ups  [email or web address] for any questions!

## 2020-01-13 NOTE — Progress Notes (Signed)
Medical Nutrition Therapy:  Appt start time: 1630 end time:  1700.  Pt states she has seen improvements in being overwhelmed when it comes to food, less food fears since starting Prozac. Has began exercising more, and feels so much better. Pt has been walking 30 minutes, 3 days a week. Also plays tennis for 90 minutes a week.  Reports more confidence in her life since exercising regularly and beginning Prozac. Pt is reading "Atomic Habit" by Zenia Resides to help make positive changes.  Pt reports food tolerance has gotten a little better, but still has occasional bloating/gas.  Pt has decreased her coffee intake, only having 4-6 oz a day. Pt reports difficulty finding balance in her nutrition during menstruation. Will sometimes restrict herself and then over indulge.   Assessment:  Primary concerns today: Food allergies/GI distress.   Preferred Learning Style:   No preference indicated   Learning Readiness:   Ready   MEDICATIONS: Hydrochlorothiazide, Avapro, NEW: Prozac   DIETARY INTAKE:  Usual eating pattern includes 3 meals and 1 snacks per day.  Everyday foods include eggs,   Avoided foods include avocado, dairy. Spicy foods    24-hr recall:   B (8 AM): Oatmeal, splash of soymilk. Strawberries (1/2 cup), cinnamon, and Stevia. Coffee, 4-6 oz with non-dairy creamer. Snk ( AM):  [employer] cashew yogurt L ( PM): Roasted cauliflower, roasted sweet potatoes, roasted chick peas, vegan pesto Snk ( PM): small apple and peanut butter D ( PM): 2 eggs, spinach, hot sauce, 2 slices ezekiel, vegan cream cheese, everything bagel seasoning. Snk ( PM):   Beverages: ~92 oz. Water, 4-6 oz coffee.  Usual physical activity: Still playing tennis once a week, walks occasionally   Progress Towards Goal(s):  In progress.   Nutritional Diagnosis:  NB-1.1 Food and nutrition-related knowledge deficit As related to food allergies.  As evidenced by persistent GI discomfort and recurring episodes of  food allergy symptoms.    Intervention:  Nutrition Education.  Counsel patient on the difficulty of living with a food allergy and the persistent symptoms. Assure patient that the focus of MNT is to address and work to relieve her symptoms. Educate patient on the potential role of certain dietary fibers in IBS and GI distress. Recommend patient continue to take a detailed food and symptom log to help better identify potential causes of allergic response and/or GI distress. Reinforce the positive effect of intuitive eating on patient's health. Counsel patient on focusing on the totality of health, including mental and emotional health. Educate patient on the potential impact of her new medication on her appetite and weight. Encourage patient to trust in her dietitian as a source of accurate, evidence-based nutrition information. Educate patient on the balanced plate eating model, and how it can positively affect her health.   NEW: Educate patient on inulin, it's role in bloating/GI discomfort, and foods that contain it. Advise patient on preparing pre-portioned food options to satisfy cravings, in order to avoid restricting/binging.  Goals:  Stick with [employer]  Have some individually wrapped chocolates/snacks for cravings. Use small utensils when eating craving foods.  Avoid foods containing inulin.  Try soy yogurt!  Try Pumpkin spice hummus.  [email or web address] for any questions!  Teaching Method Utilized:  Visual Auditory  Handouts given:  Inulin containing foods   Barriers to learning/adherence to lifestyle change: None  Demonstrated degree of understanding via:  Teach Back   Monitoring/Evaluation:  Dietary intake, GI symptoms, and changes in appetite  log in 1  month(s).

## 2020-01-27 IMAGING — DX DG CHEST 2V
2 series · 2 of 2 positions shown · non-contrast
Comparison: None.

CLINICAL DATA: Cough and congestion.  History of asthma.

EXAM:
CHEST - 2 VIEW

[chest pa]
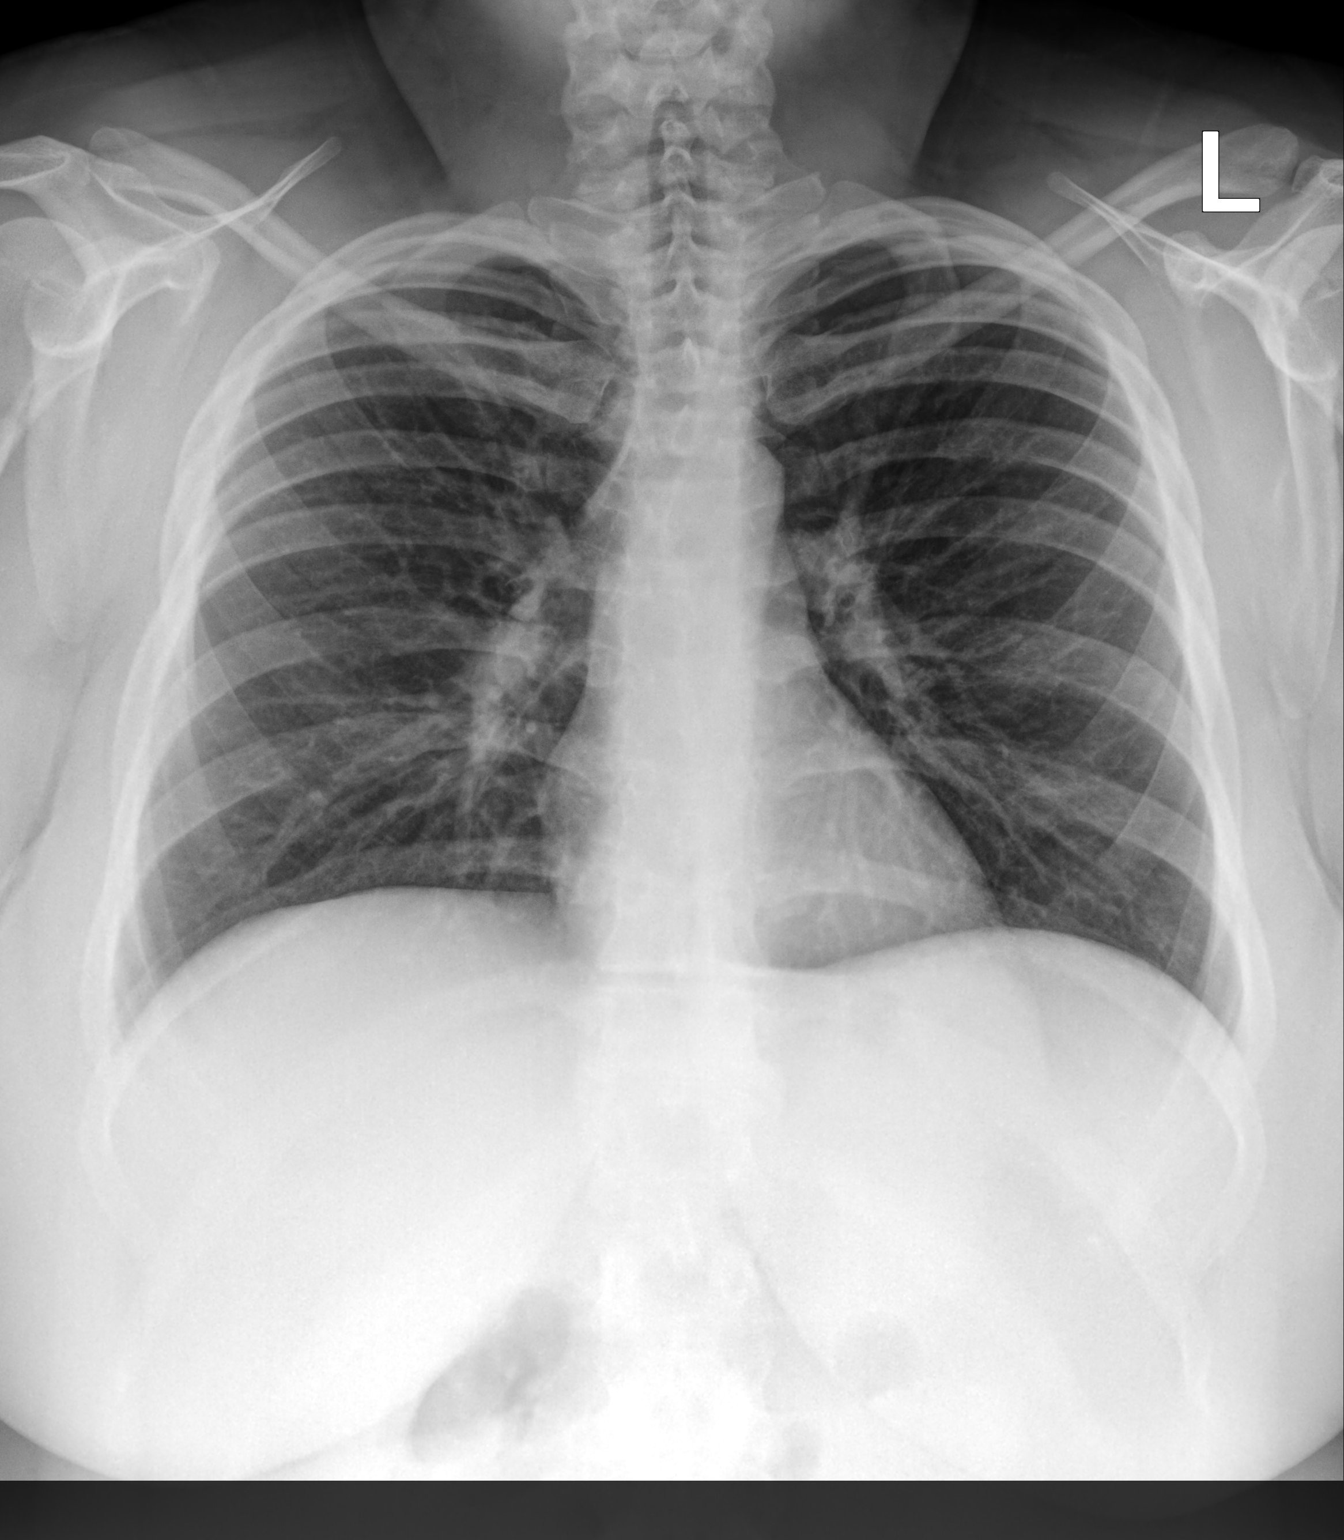

[chest lat]
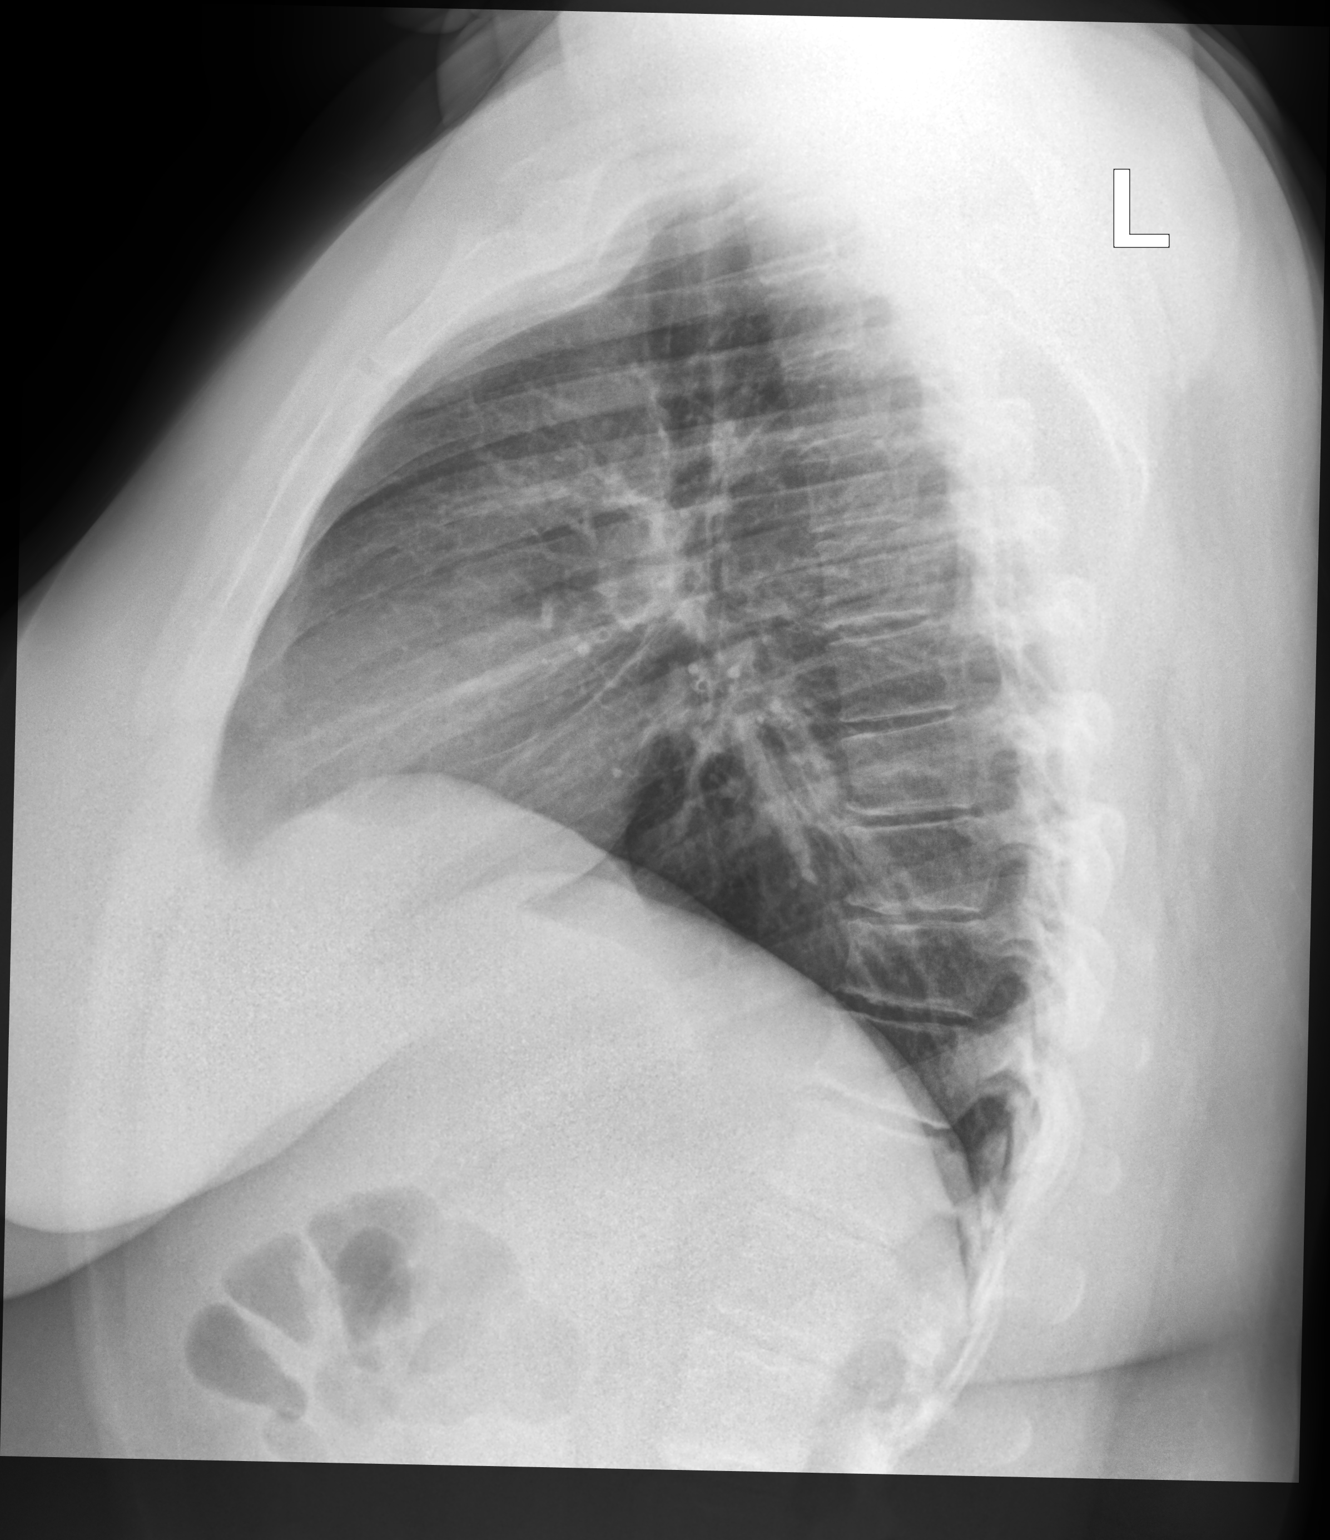

[2 of 2 positions shown; findings below may reference images not displayed]

FINDINGS: Cardiomediastinal silhouette is within normal limits in size and
configuration. Lungs are clear. Lung volumes are normal. No evidence
of pneumonia. No pleural effusion. No pneumothorax seen.

No acute appearing osseous abnormality. Mild kyphosis of the lower
thoracic spine, with associated mild disc desiccations.
IMPRESSION: No active cardiopulmonary disease. No evidence of pneumonia or
pulmonary edema.

## 2020-02-10 ENCOUNTER — Telehealth (INDEPENDENT_AMBULATORY_CARE_PROVIDER_SITE_OTHER): Payer: 59 | Admitting: Family Medicine

## 2020-02-10 ENCOUNTER — Encounter: Payer: Self-pay | Admitting: Family Medicine

## 2020-02-10 DIAGNOSIS — M549 Dorsalgia, unspecified: Secondary | ICD-10-CM

## 2020-02-10 DIAGNOSIS — M542 Cervicalgia: Secondary | ICD-10-CM | POA: Diagnosis not present

## 2020-02-10 MED ORDER — TIZANIDINE HCL 2 MG PO CAPS: 2.0000 mg | ORAL_CAPSULE | Freq: Three times a day (TID) | ORAL | 0 refills | Status: DC | PRN

## 2020-02-10 NOTE — Progress Notes (Signed)
Virtual Visit via Video Note  I connected with Kristy Ross  on 02/10/20 at  1:00 PM EST by a video enabled telemedicine application and verified that I am speaking with the correct person using two identifiers.  Location patient: home, [place] Location provider:work or home office Persons participating in the virtual visit: patient, provider  I discussed the limitations of evaluation and management by telemedicine and the availability of in person appointments. The patient expressed understanding and agreed to proceed.   HPI:  Acute telemedicine visit for Neck Pain: -Onset: 1 week ago and she thought started due to increased computer work - hunched over computer -has been trying stretches, but is not getting better -Symptoms include:bilat shoulder, bilat neck L>R, had bad spasm in the muscles last night - a little better today but still in pain -Denies: fevers, malaise, radiation of pain into arms or hands, weakness or numbness, headache -Has tried:heat, ice, stretching, ibuprofen did not help much -Pertinent past medical history: none -Pertinent medication allergies: nkda -fdlmp: 02/10/20  ROS: See pertinent positives and negatives per HPI.  Past Medical History:  Diagnosis Date  . Allergy   . Asthma   . Blood in stool   . BMI 40.0-44.9, adult (HCC) 12/04/2015  . Depression   . History of chicken pox   . Hypertension   . IBS (irritable bowel syndrome)     History reviewed. No pertinent surgical history.   Current Outpatient Medications:  .  cetirizine (ZYRTEC) 10 MG tablet, Take 10 mg by mouth daily., Disp: , Rfl:  .  FLOVENT HFA 44 MCG/ACT inhaler, INHALE 1 PUFF BY MOUTH TWICE A DAY, Disp: 10.6 Inhaler, Rfl: 2 .  FLUoxetine (PROZAC) 10 MG capsule, Take 1 capsule (10 mg total) by mouth daily., Disp: 90 capsule, Rfl: 1 .  fluticasone (FLONASE) 50 MCG/ACT nasal spray, Place into both nostrils as needed. , Disp: , Rfl:  .  hydrochlorothiazide (HYDRODIURIL) 25 MG tablet, TAKE 1 TABLET  BY MOUTH EVERY DAY, Disp: 90 tablet, Rfl: 1 .  irbesartan (AVAPRO) 75 MG tablet, TAKE 1 TABLET BY MOUTH EVERY DAY, Disp: 30 tablet, Rfl: 2 .  PROAIR HFA 108 (90 Base) MCG/ACT inhaler, INHALE 1 PUFF INTO THE LUNGS EVERY 6 (SIX) HOURS AS NEEDED FOR WHEEZING OR SHORTNESS OF BREATH., Disp: 8.5 g, Rfl: 1 .  triamcinolone ointment (KENALOG) 0.1 %, Apply 1 application topically 2 (two) times daily., Disp: 30 g, Rfl: 2 .  tizanidine (ZANAFLEX) 2 MG capsule, Take 1 capsule (2 mg total) by mouth 3 (three) times daily as needed for muscle spasms., Disp: 15 capsule, Rfl: 0  EXAM:  VITALS per patient if applicable:  GENERAL: alert, oriented, appears well and in no acute distress  HEENT: atraumatic, conjunttiva clear, no obvious abnormalities on inspection of external nose and ears  NECK: normal movements of the head and neck  LUNGS: on inspection no signs of respiratory distress, breathing rate appears normal, no obvious gross SOB, gasping or wheezing  CV: no obvious cyanosis  MS: On video visit exam moves all visible extremities without noticeable abnormality, she is able to rotate the head from right to left and extend and flex the head, she points to the bilateral suboccipital muscles, cervical paraspinal muscles and upper fibers of the traps along the shoulders as the area of discomfort, left greater than right  PSYCH/NEURO: pleasant and cooperative, no obvious depression or anxiety, speech and thought processing grossly intact  ASSESSMENT AND PLAN:  Discussed the following assessment and plan:  Neck  pain  Upper back pain  -we discussed possible serious and likely etiologies, options for evaluation and workup, limitations of telemedicine visit vs in person visit, treatment, treatment risks and precautions. Pt prefers to treat via telemedicine empirically rather than in person at this moment.  Query neck strain from poor work posture, versus other.  She opted to try empiric treatment with a  muscle relaxer, Tylenol 500 to 1000 mg up to 3 times a day as needed for pain, topical menthol and correcting posture.  Did provide a link to the [facility] ergonomics information on patient instructions.  Did recommend prompt in person evaluation if any worsening, new symptoms or if it is not improving over the next 1 to 2 days. Work/School slipped offered: Declined She agrees to call if needed. Advised to seek prompt in person care if worsening, new symptoms arise, or if is not improving with treatment. Discussed options for inperson care if PCP office not available.  Did let her know about local orthopedic urgent care options.  Did let this patient know that I only do telemedicine on Tuesdays and Thursdays for [place]. Advised to schedule follow up visit with PCP or [facility] if any further questions or concerns to avoid delays in care.   I discussed the assessment and treatment plan with the patient. The patient was provided an opportunity to ask questions and all were answered. The patient agreed with the plan and demonstrated an understanding of the instructions.     Terressa Koyanagi, DO

## 2020-02-10 NOTE — Patient Instructions (Addendum)
-  I sent the medication(s) we discussed to your pharmacy: Meds ordered this encounter  Medications  . tizanidine (ZANAFLEX) 2 MG capsule    Sig: Take 1 capsule (2 mg total) by mouth 3 (three) times daily as needed for muscle spasms.    Dispense:  15 capsule    Refill:  0   Also can try tylenol 500-1000mg  up to 3 times daily.  Topical menthol (tiger balm) can also sometime help.  Heat or Ice if it seems to help.  Make sure you have proper posture while sitting at your computer.  Please check out the information in the link below:  [id number]   I hope you are feeling better soon!  Seek in person care promptly with your primary care doctor, urgent care or orthopedic urgent care if your symptoms worsen, new concerns arise or you are not improving with treatment.  It was nice to meet you today. I help [facility] out with telemedicine visits on Tuesdays and Thursdays and am available for visits on those days. If you have any concerns or questions following this visit please schedule a follow up visit with your Primary Care doctor or seek care at a local urgent care clinic to avoid delays in care.

## 2020-02-14 ENCOUNTER — Other Ambulatory Visit: Payer: Self-pay

## 2020-02-14 ENCOUNTER — Ambulatory Visit: Payer: 59 | Admitting: Family Medicine

## 2020-02-14 ENCOUNTER — Encounter: Payer: Self-pay | Admitting: Family Medicine

## 2020-02-14 VITALS — BP 100/80 | HR 83 | Temp 98.3°F | Ht 63.0 in | Wt 243.3 lb

## 2020-02-14 DIAGNOSIS — F330 Major depressive disorder, recurrent, mild: Secondary | ICD-10-CM

## 2020-02-14 DIAGNOSIS — I10 Essential (primary) hypertension: Secondary | ICD-10-CM | POA: Diagnosis not present

## 2020-02-14 DIAGNOSIS — F419 Anxiety disorder, unspecified: Secondary | ICD-10-CM

## 2020-02-14 NOTE — Progress Notes (Signed)
Kristy Ross DOB: Sep 25, 1990 Encounter date: 02/14/2020  This is a 29 y.o. female who presents with Chief Complaint  Patient presents with  . Follow-up    History of present illness: Last visit with me was 09/08/19.  At that time she felt that therapy was working well for her and helping with mindfulness as well as overall anxiety control, but she reached out to me a couple of months after that visit.  After some discussion, we ended up starting Prozac, which she had tolerated in the past for mood.  Things are better; thoughts are a lot more controlled. Less hopeless; more hopeful. Anxiety has improved. Easier to rationalize. Thinks combo of therapy and prozac is much better.   Has been working with dietician and they are working on not linking "med to gaining weight".   Asthma has been good - only issue if cleaning/dust exposure. Allergies not great; although better off dairy.   Allergies  Allergen Reactions  . Bee Venom Anaphylaxis  . Avocado Other (See Comments)    Stomach pain, flu like symptoms  . Niacin And Related Hives  . Milk-Related Compounds Rash   Current Meds  Medication Sig  . cetirizine (ZYRTEC) 10 MG tablet Take 10 mg by mouth daily.  . Cholecalciferol (VITAMIN D3 PO) Take 2,000 Units by mouth daily.  . Ferrous Sulfate (IRON PO) Take 25 mg by mouth. Solgar brand  . FLOVENT HFA 44 MCG/ACT inhaler INHALE 1 PUFF BY MOUTH TWICE A DAY  . FLUoxetine (PROZAC) 10 MG capsule Take 1 capsule (10 mg total) by mouth daily.  . fluticasone (FLONASE) 50 MCG/ACT nasal spray Place into both nostrils as needed.   . hydrochlorothiazide (HYDRODIURIL) 25 MG tablet TAKE 1 TABLET BY MOUTH EVERY DAY  . irbesartan (AVAPRO) 75 MG tablet TAKE 1 TABLET BY MOUTH EVERY DAY  . PROAIR HFA 108 (90 Base) MCG/ACT inhaler INHALE 1 PUFF INTO THE LUNGS EVERY 6 (SIX) HOURS AS NEEDED FOR WHEEZING OR SHORTNESS OF BREATH.  . Probiotic Product (PROBIOTIC PO) Take by mouth daily.  . tizanidine (ZANAFLEX) 2  MG capsule Take 1 capsule (2 mg total) by mouth 3 (three) times daily as needed for muscle spasms.  Marland Kitchen triamcinolone ointment (KENALOG) 0.1 % Apply 1 application topically 2 (two) times daily.    Review of Systems  Constitutional: Negative for chills, fatigue and fever.  Respiratory: Negative for cough, chest tightness, shortness of breath and wheezing.   Cardiovascular: Negative for chest pain, palpitations and leg swelling.    Objective:  BP 100/80 (BP Location: Left Arm, Patient Position: Sitting, Cuff Size: Large)   Pulse 83   Temp 98.3 F (36.8 C) (Oral)   Ht 5\' 3"  (1.6 m)   Wt 243 lb 4.8 oz (110.4 kg)   LMP 02/10/2020 (Exact Date)   BMI 43.10 kg/m   Weight: 243 lb 4.8 oz (110.4 kg)   BP Readings from Last 3 Encounters:  02/14/20 100/80  06/04/19 110/70  05/03/19 100/80   Wt Readings from Last 3 Encounters:  02/14/20 243 lb 4.8 oz (110.4 kg)  10/22/19 237 lb 8 oz (107.7 kg)  05/03/19 231 lb 6.4 oz (105 kg)    Physical Exam Constitutional:      General: She is not in acute distress.    Appearance: She is well-developed.  Cardiovascular:     Rate and Rhythm: Normal rate and regular rhythm.     Heart sounds: Normal heart sounds. No murmur heard.  No friction rub.  Pulmonary:  Effort: Pulmonary effort is normal. No respiratory distress.     Breath sounds: Normal breath sounds. No wheezing or rales.  Musculoskeletal:     Right lower leg: No edema.     Left lower leg: No edema.  Neurological:     Mental Status: She is alert and oriented to person, place, and time.  Psychiatric:        Behavior: Behavior normal.    Depression screen [facility] 2/9 02/14/2020 10/22/2019 05/03/2019 11/06/2018 04/16/2018  Decreased Interest 0 0 1 0 0  Down, Depressed, Hopeless 0 0 0 0 0  PHQ - 2 Score 0 0 1 0 0  Altered sleeping 1 - 0 - 1  Tired, decreased energy - - 0 - 2  Change in appetite 0 - 1 - 2  Feeling bad or failure about yourself  1 - 1 - 0  Trouble concentrating 0 - 0 - 0   Moving slowly or fidgety/restless 0 - 0 - 1  Suicidal thoughts 0 - 0 - 0  PHQ-9 Score 2 - 3 - 6  Difficult doing work/chores Not difficult at all - - - -    Assessment/Plan  1. Essential hypertension Continue with irbesartan 75mg  daily, hctz 25 daily. Well controlled.  2. Mild episode of recurrent major depressive disorder (HCC) She has done well with prozac. Continue current dose.   3. Anxiety Has improved. Doing well with therapy as well as medication.   We discussed limiting goals with healthy behavior changes- ie not trying to change too much. She is working on - portions/variety, so I suggested continuing with that and then adding back in exercise. She enjoys tennis; plays once a week. We discussed yoga class starting at work which she plans to do. Discussed tennis lesson. Discussed 9 round for quick, good, convenient workout.    Return in about 6 months (around 08/14/2020) for physical exam.     10/14/2020, MD

## 2020-02-17 ENCOUNTER — Other Ambulatory Visit: Payer: Self-pay | Admitting: Family Medicine

## 2020-03-21 ENCOUNTER — Other Ambulatory Visit: Payer: Self-pay | Admitting: Family Medicine

## 2020-03-23 ENCOUNTER — Ambulatory Visit: Payer: 59 | Admitting: Dietician

## 2020-04-04 ENCOUNTER — Ambulatory Visit: Payer: 59 | Admitting: Dietician

## 2020-04-13 ENCOUNTER — Encounter: Payer: 59 | Attending: Family Medicine | Admitting: Dietician

## 2020-04-13 ENCOUNTER — Other Ambulatory Visit: Payer: Self-pay

## 2020-04-13 ENCOUNTER — Encounter: Payer: Self-pay | Admitting: Dietician

## 2020-04-13 DIAGNOSIS — E669 Obesity, unspecified: Secondary | ICD-10-CM | POA: Insufficient documentation

## 2020-04-13 NOTE — Progress Notes (Signed)
Medical Nutrition Therapy:  Appt start time: 1630 end time:  1700.  Pt reports doing very well since our last visit.  Pt reports their relationship with food has gotten so much better, and their mother has even noticed the change. Pt states their mother eats very healthy, and pt would used to sneak sweets into their parents house but did not feel that they had top do that when they visited recently. Pt reports feeling free now. Pt reports eating their mothers blackened salmon and really enjoying it.  Pt reports reading "Intuitve Eating" and has been listening to their internal queues more. Pt reports sometimes getting into a rut of food choices that have minimized their IBS. Pt states dinners are not a problem, but lunches are getting difficult to find variety in. Pt reports till being hesitant to try new foods due to potential GI symptoms.  Pt reports reintroducing RAW tomatoes into their diet and still gets reflux. Pt reports going through phases of getting nauseaus when thinking of certain foods and it will vary as to which foods trigger it depending on the time. Pt states they are going to sign up for the [facility] tomorrow, and is highly motivated to move their body! Pt reports only drinking 8 oz of coffee a day now and it has helped decrease anxiety   Assessment:  Primary concerns today: Food allergies/GI distress.   Preferred Learning Style:   No preference indicated   Learning Readiness:   Ready   MEDICATIONS: Hydrochlorothiazide, Avapro, NEW: Prozac   DIETARY INTAKE:  Usual eating pattern includes 3 meals and 1 snacks per day.  Everyday foods include eggs   Avoided foods include avocado, dairy. Spicy foods    24-hr recall:   B (8 AM): 2 eggs fried, hot sauce, everything but the bagel seasoning, toast,  1/2 cup strawberries, 8 oz coffee with soy milk and oatmilk creamer Snk ( AM):  [employer] cashew yogurt L ( PM): Chicken noodle soup, french bread Snk ( PM): handful of  almonds and raisins D ( PM):  Chicken noodle soup, french bread Snk ( PM):   Beverages: ~92 oz. Water, 8 oz coffee.  Usual physical activity: Tennis once a week, walks occasionally   Progress Towards Goal(s):  In progress.   Nutritional Diagnosis:  NB-1.1 Food and nutrition-related knowledge deficit As related to food allergies.  As evidenced by persistent GI discomfort and recurring episodes of food allergy symptoms.    Intervention:  Nutrition Education.  Counsel patient on the difficulty of living with a food allergy and the persistent symptoms. Assure patient that the focus of MNT is to address and work to relieve her symptoms. Educate patient on the potential role of certain dietary fibers in IBS and GI distress. Recommend patient continue to take a detailed food and symptom log to help better identify potential causes of allergic response and/or GI distress. Reinforce the positive effect of intuitive eating on patient's health. Counsel patient on focusing on the totality of health, including mental and emotional health. Educate patient on the potential impact of her new medication on her appetite and weight. Encourage patient to trust in her dietitian as a source of accurate, evidence-based nutrition information. Educate patient on the balanced plate eating model, and how it can positively affect her health.   NEW: Educate patient on inulin, it's role in bloating/GI discomfort, and foods that contain it. Advise patient on preparing pre-portioned food options to satisfy cravings, in order to avoid restricting/binging.  Goals:  Adriana Simas yourself a steak! Sear the outside and bake until internal temp of 130-140 degrees  Think about foods that you can prepare separately to create different meals with the same ingredients.  Expand your food options and step outside of your comfort zone.  Great job on joining [employer]!!!! Try any and everything they have to offer!  Teaching Method Utilized:   Visual Auditory  Handouts given:  Inulin containing foods   Barriers to learning/adherence to lifestyle change: None  Demonstrated degree of understanding via:  Teach Back   Monitoring/Evaluation:  Dietary intake, GI symptoms, and changes in appetite  log in 1 month(s).

## 2020-04-13 NOTE — Patient Instructions (Addendum)
Kristy Ross yourself a steak! Sear the outside and bake until internal temp of 130-140 degrees  Think about foods that you can prepare separately to create different meals with the same ingredients.  Expand your food options and step outside of your comfort zone.  Great job on joining [employer]!!!! Try any and everything they have to offer!

## 2020-05-21 ENCOUNTER — Other Ambulatory Visit: Payer: Self-pay | Admitting: Family Medicine

## 2020-05-25 ENCOUNTER — Encounter: Payer: Self-pay | Admitting: Dietician

## 2020-05-25 ENCOUNTER — Other Ambulatory Visit: Payer: Self-pay

## 2020-05-25 ENCOUNTER — Encounter: Payer: 59 | Attending: Family Medicine | Admitting: Dietician

## 2020-05-25 DIAGNOSIS — E669 Obesity, unspecified: Secondary | ICD-10-CM | POA: Insufficient documentation

## 2020-05-25 NOTE — Patient Instructions (Addendum)
   Use a combination of carrots, celery, peppers, and cucumbers as a good dipping snack.   Try Roots brand black bean or lima bean hummus.  Adriana Simas yourself a steak! Sear the outside and bake until internal temp of 130-140 degrees.  [facility]  Start saving your pennies for your bike!  Never let anyone steal your joy! You are amazing!

## 2020-05-25 NOTE — Progress Notes (Signed)
Medical Nutrition Therapy:  Appt start time: 1600 end time:  1630.  Pt reports doing very well since our last visit.   [facility] [employer] and is [facility]. Pt is doing yoga 3 times a week there, and once a week at work. Pt states they have a goal of being a runner. Pt reports starting a "Couck to 5k" type of training program. Pt reports lots of stress personally and professionally and has been able to avoid emotional eating, restricitng/binging. Pt reports being able to be present in their feelings and make conscious decisions about their food choices. Pt reports sweets have no control over them anymore. Pt reports still experiencing bloating and pain occasionally with raw vegetables. Black beans do not cause GI distress. Pt reports having 2 cups of coffee this morning and stated their heart was racing and got very jittery. Pt reports work is very stressful and has been more anxious. Pt has been making new friends that live a lifestyle pt wants align with. Pt reports some trouble in that social circle   Assessment:  Primary concerns today: Food allergies/GI distress.   Preferred Learning Style:   No preference indicated   Learning Readiness:   Ready   MEDICATIONS: Hydrochlorothiazide, Avapro, NEW: Prozac   DIETARY INTAKE:  Usual eating pattern includes 3 meals and 1 snacks per day.  Everyday foods include eggs   Avoided foods include avocado, dairy. Spicy foods    24-hr recall:   B (8 AM): Oatmeal with peanut butter, strawberries, maple syrup and cinnamon, splash of soymilk, 1 cup of coffee with oat milk Snk ( AM): none L ( PM): Leftover shredded chicken in salsa, with tortilla chips Snk ( PM): Smoothie with PB, vegan protein powder, mixed berries, and soy milk D ( PM):  Whole wheat penne, spinach, kale pesto, rotisserie chicken Snk ( PM):  none Beverages: ~92 oz. Water, 8 oz coffee.  Usual physical activity: Tennis once a week, walks occasionally   Progress Towards  Goal(s):  In progress.   Nutritional Diagnosis:  NB-1.1 Food and nutrition-related knowledge deficit As related to food allergies.  As evidenced by persistent GI discomfort and recurring episodes of food allergy symptoms.    Intervention:  Nutrition Education.  Counsel patient on the difficulty of living with a food allergy and the persistent symptoms. Assure patient that the focus of MNT is to address and work to relieve her symptoms. Educate patient on the potential role of certain dietary fibers in IBS and GI distress. Recommend patient continue to take a detailed food and symptom log to help better identify potential causes of allergic response and/or GI distress. Reinforce the positive effect of intuitive eating on patient's health. Counsel patient on focusing on the totality of health, including mental and emotional health. Educate patient on the potential impact of her new medication on her appetite and weight. Encourage patient to trust in her dietitian as a source of accurate, evidence-based nutrition information. Educate patient on the balanced plate eating model, and how it can positively affect her health.   NEW: Educate patient on inulin, it's role in bloating/GI discomfort, and foods that contain it. Advise patient on preparing pre-portioned food options to satisfy cravings, in order to avoid restricting/binging.  Goals:  Use a combination of carrots, celery, peppers, and cucumbers as a good dipping snack.   Try Roots brand black bean or lima bean hummus.  Adriana Simas yourself a steak! Sear the outside and bake until internal temp of 130-140  degrees.  [facility]  Start saving your pennies for your bike!  Never let anyone steal your joy! You are amazing!   Teaching Method Utilized:  Visual Auditory  Handouts given:  Inulin containing foods   Barriers to learning/adherence to lifestyle change: None  Demonstrated degree of understanding via:  Teach Back    Monitoring/Evaluation:  Dietary intake, GI symptoms, and changes in appetite  log in 1 month(s).

## 2020-06-21 ENCOUNTER — Other Ambulatory Visit: Payer: Self-pay | Admitting: Family Medicine

## 2020-06-25 ENCOUNTER — Other Ambulatory Visit: Payer: Self-pay | Admitting: Family Medicine

## 2020-07-07 ENCOUNTER — Ambulatory Visit: Payer: 59 | Admitting: Family Medicine

## 2020-07-07 ENCOUNTER — Other Ambulatory Visit: Payer: Self-pay

## 2020-07-07 ENCOUNTER — Other Ambulatory Visit: Payer: Self-pay | Admitting: Family Medicine

## 2020-07-07 ENCOUNTER — Encounter: Payer: Self-pay | Admitting: Family Medicine

## 2020-07-07 VITALS — BP 102/78 | HR 79 | Temp 97.9°F | Ht 63.0 in | Wt 238.8 lb

## 2020-07-07 DIAGNOSIS — Z803 Family history of malignant neoplasm of breast: Secondary | ICD-10-CM

## 2020-07-07 DIAGNOSIS — N63 Unspecified lump in unspecified breast: Secondary | ICD-10-CM

## 2020-07-07 DIAGNOSIS — R922 Inconclusive mammogram: Secondary | ICD-10-CM | POA: Diagnosis not present

## 2020-07-07 NOTE — Progress Notes (Signed)
Kristy Ross DOB: 12/22/1990 Encounter date: 07/07/2020  This is a 30 y.o. female who presents with Chief Complaint  Patient presents with  . Breast Mass    Patient complains of right breast lump 1 month ago, states she notices "tissue feels different and color change in the vein", denies pain or discharge    History of present illness: No injury, no reason for this. Seems to have stayed pretty consistent. Noted 1.5-2 mo ago and was watching it - watching with cycle and didn't seem to change it. Just feels like marble. Right breast feels different than left breast - maybe more swollen.   Allergies  Allergen Reactions  . Bee Venom Anaphylaxis  . Avocado Other (See Comments)    Stomach pain, flu like symptoms  . Niacin And Related Hives  . Milk-Related Compounds Rash   Current Meds  Medication Sig  . cetirizine (ZYRTEC) 10 MG tablet Take 10 mg by mouth daily.  . Cholecalciferol (VITAMIN D3 PO) Take 2,000 Units by mouth daily.  . Ferrous Sulfate (IRON PO) Take 25 mg by mouth. Solgar brand  . FLOVENT HFA 44 MCG/ACT inhaler INHALE 1 PUFF BY MOUTH TWICE A DAY  . FLUoxetine (PROZAC) 10 MG capsule TAKE 1 CAPSULE BY MOUTH EVERY DAY  . fluticasone (FLONASE) 50 MCG/ACT nasal spray Place into both nostrils as needed.   . hydrochlorothiazide (HYDRODIURIL) 25 MG tablet TAKE 1 TABLET BY MOUTH EVERY DAY  . irbesartan (AVAPRO) 75 MG tablet TAKE 1 TABLET BY MOUTH EVERY DAY  . PROAIR HFA 108 (90 Base) MCG/ACT inhaler INHALE 1 PUFF INTO THE LUNGS EVERY 6 (SIX) HOURS AS NEEDED FOR WHEEZING OR SHORTNESS OF BREATH.  . Probiotic Product (PROBIOTIC PO) Take by mouth daily.  . tizanidine (ZANAFLEX) 2 MG capsule Take 1 capsule (2 mg total) by mouth 3 (three) times daily as needed for muscle spasms.  Marland Kitchen triamcinolone ointment (KENALOG) 0.1 % Apply 1 application topically 2 (two) times daily.    Review of Systems  Constitutional: Negative for chills, fatigue and fever.  Respiratory: Negative for cough,  chest tightness, shortness of breath and wheezing.   Cardiovascular: Negative for chest pain, palpitations and leg swelling.  Breast: see hpi - mass right breast. No change in size since noting 2 mo ago. Not tender. No nipple drainage, skin changes.   Objective:  BP 102/78 (BP Location: Left Arm, Patient Position: Sitting, Cuff Size: Large)   Pulse 79   Temp 97.9 F (36.6 C) (Oral)   Ht 5\' 3"  (1.6 m)   Wt 238 lb 12.8 oz (108.3 kg)   LMP 07/07/2020 (Exact Date)   SpO2 97%   BMI 42.30 kg/m   Weight: 238 lb 12.8 oz (108.3 kg)   BP Readings from Last 3 Encounters:  07/07/20 102/78  02/14/20 100/80  06/04/19 110/70   Wt Readings from Last 3 Encounters:  07/07/20 238 lb 12.8 oz (108.3 kg)  02/14/20 243 lb 4.8 oz (110.4 kg)  10/22/19 237 lb 8 oz (107.7 kg)    Physical Exam Vitals reviewed.  Constitutional:      Appearance: Normal appearance.  Chest:  Breasts: Breasts are symmetrical.     Right: Mass present. No swelling, bleeding, inverted nipple, nipple discharge, skin change, tenderness, axillary adenopathy or supraclavicular adenopathy.     Left: No swelling, bleeding, inverted nipple, mass, nipple discharge, skin change, tenderness, axillary adenopathy or supraclavicular adenopathy.        Comments: 89mm slightly mobile round mass noted 12-1 oclock position just  superior to areola. Breast tissue is dense making exam difficult.   Left breast exam normal although tissue also very dense, limiting exam.  Lymphadenopathy:     Upper Body:     Right upper body: No supraclavicular, axillary or pectoral adenopathy.     Left upper body: No supraclavicular, axillary or pectoral adenopathy.  Neurological:     Mental Status: She is alert.     Assessment/Plan  1. Breast nodule Present 2 months, fam hx of breast cancer. I feel further evaluation with imaging is warranted. This was ordered today. Instructed patient to let me know if it takes over a month to get imaging completed.   - MM Digital Diagnostic Unilat R; Future - US BREAST COMPLETE UNI RIGHT INC AXILLA; Future  2. Dense breast tissue Since going for imaging, with dense breasts and family history; I think it would be reasonable to establish baseline on left breast. Exam is difficult due to density.  - MM Digital Screening Unilat L; Future  3. Family history of breast cancer in mother See above. - MM Digital Diagnostic Unilat R; Future - US BREAST COMPLETE UNI RIGHT INC AXILLA; Future - MM Digital Screening Unilat L; Future  Return if symptoms worsen or fail to improve.      Theodis Shove, MD

## 2020-07-07 NOTE — Patient Instructions (Signed)
You can call [facility] to set up breast imaging. Let me know if it will take over a month to get in.

## 2020-07-12 ENCOUNTER — Ambulatory Visit: Payer: 59 | Admitting: Dietician

## 2020-07-29 ENCOUNTER — Ambulatory Visit
Admission: RE | Admit: 2020-07-29 | Discharge: 2020-07-29 | Disposition: A | Discharge status: Home / Self Care | Payer: 59 | Source: Ambulatory Visit | Attending: Family Medicine | Admitting: Family Medicine

## 2020-07-29 ENCOUNTER — Other Ambulatory Visit: Payer: Self-pay

## 2020-07-29 ENCOUNTER — Other Ambulatory Visit: Payer: Self-pay | Admitting: Family Medicine

## 2020-07-29 DIAGNOSIS — Z803 Family history of malignant neoplasm of breast: Secondary | ICD-10-CM

## 2020-07-29 DIAGNOSIS — N63 Unspecified lump in unspecified breast: Secondary | ICD-10-CM

## 2020-08-01 ENCOUNTER — Encounter: Payer: Self-pay | Admitting: Family Medicine

## 2020-08-08 ENCOUNTER — Ambulatory Visit
Admission: RE | Admit: 2020-08-08 | Discharge: 2020-08-08 | Disposition: A | Discharge status: Home / Self Care | Payer: 59 | Source: Ambulatory Visit | Attending: Family Medicine | Admitting: Family Medicine

## 2020-08-08 ENCOUNTER — Other Ambulatory Visit: Payer: Self-pay

## 2020-08-08 DIAGNOSIS — N63 Unspecified lump in unspecified breast: Secondary | ICD-10-CM

## 2020-08-08 DIAGNOSIS — Z803 Family history of malignant neoplasm of breast: Secondary | ICD-10-CM

## 2020-08-11 ENCOUNTER — Other Ambulatory Visit: Payer: Self-pay

## 2020-08-14 ENCOUNTER — Ambulatory Visit (INDEPENDENT_AMBULATORY_CARE_PROVIDER_SITE_OTHER): Payer: 59 | Admitting: Family Medicine

## 2020-08-14 ENCOUNTER — Other Ambulatory Visit: Payer: 59

## 2020-08-14 ENCOUNTER — Other Ambulatory Visit: Payer: Self-pay

## 2020-08-14 ENCOUNTER — Encounter: Payer: Self-pay | Admitting: Family Medicine

## 2020-08-14 VITALS — BP 108/80 | HR 80 | Temp 98.2°F | Ht 62.5 in | Wt 242.5 lb

## 2020-08-14 DIAGNOSIS — Z13220 Encounter for screening for lipoid disorders: Secondary | ICD-10-CM

## 2020-08-14 DIAGNOSIS — Z0000 Encounter for general adult medical examination without abnormal findings: Secondary | ICD-10-CM | POA: Diagnosis not present

## 2020-08-14 DIAGNOSIS — J4520 Mild intermittent asthma, uncomplicated: Secondary | ICD-10-CM

## 2020-08-14 DIAGNOSIS — I10 Essential (primary) hypertension: Secondary | ICD-10-CM | POA: Diagnosis not present

## 2020-08-14 DIAGNOSIS — R590 Localized enlarged lymph nodes: Secondary | ICD-10-CM | POA: Diagnosis not present

## 2020-08-14 DIAGNOSIS — L301 Dyshidrosis [pompholyx]: Secondary | ICD-10-CM

## 2020-08-14 LAB — LIPID PANEL
Cholesterol: 170 mg/dL (ref 0–200)
HDL: 47.8 mg/dL (ref >39.00)
LDL Cholesterol: 83 mg/dL (ref 0–99)
NonHDL: 122.26
Total CHOL/HDL Ratio: 4
Triglycerides: 194 mg/dL — ABNORMAL HIGH (ref 0.0–149.0)
VLDL: 38.8 mg/dL (ref 0.0–40.0)

## 2020-08-14 LAB — CBC WITH DIFFERENTIAL/PLATELET
Basophils Absolute: 0.1 10*3/uL (ref 0.0–0.1)
Basophils Relative: 1.4 % (ref 0.0–3.0)
Eosinophils Absolute: 0.5 10*3/uL (ref 0.0–0.7)
Eosinophils Relative: 7.1 % — ABNORMAL HIGH (ref 0.0–5.0)
HCT: 43.5 % (ref 36.0–46.0)
Hemoglobin: 15.5 g/dL — ABNORMAL HIGH (ref 12.0–15.0)
Lymphocytes Relative: 26.2 % (ref 12.0–46.0)
Lymphs Abs: 1.8 10*3/uL (ref 0.7–4.0)
MCHC: 35.7 g/dL (ref 30.0–36.0)
MCV: 95.8 fl (ref 78.0–100.0)
Monocytes Absolute: 0.4 10*3/uL (ref 0.1–1.0)
Monocytes Relative: 6.3 % (ref 3.0–12.0)
Neutro Abs: 4.1 10*3/uL (ref 1.4–7.7)
Neutrophils Relative %: 59 % (ref 43.0–77.0)
Platelets: 326 10*3/uL (ref 150.0–400.0)
RBC: 4.54 Mil/uL (ref 3.87–5.11)
RDW: 12.6 % (ref 11.5–15.5)
WBC: 6.9 10*3/uL (ref 4.0–10.5)

## 2020-08-14 LAB — COMPREHENSIVE METABOLIC PANEL
ALT: 18 U/L (ref 0–35)
AST: 20 U/L (ref 0–37)
Albumin: 4.4 g/dL (ref 3.5–5.2)
Alkaline Phosphatase: 80 U/L (ref 39–117)
BUN: 13 mg/dL (ref 6–23)
CO2: 27 mEq/L (ref 19–32)
Calcium: 9.3 mg/dL (ref 8.4–10.5)
Chloride: 102 mEq/L (ref 96–112)
Creatinine, Ser: 0.83 mg/dL (ref 0.40–1.20)
GFR: 94.03 mL/min (ref >60.00)
Glucose, Bld: 85 mg/dL (ref 70–99)
Potassium: 3.9 mEq/L (ref 3.5–5.1)
Sodium: 137 mEq/L (ref 135–145)
Total Bilirubin: 0.5 mg/dL (ref 0.2–1.2)
Total Protein: 7.4 g/dL (ref 6.0–8.3)

## 2020-08-14 MED ORDER — IRBESARTAN 75 MG PO TABS: 75.0000 mg | ORAL_TABLET | Freq: Every day | ORAL | 5 refills | Status: DC

## 2020-08-14 MED ORDER — HYDROCHLOROTHIAZIDE 25 MG PO TABS: 1.0000 | ORAL_TABLET | Freq: Every day | ORAL | 5 refills | Status: DC

## 2020-08-14 MED ORDER — BETAMETHASONE DIPROPIONATE 0.05 % EX CREA: TOPICAL_CREAM | Freq: Two times a day (BID) | CUTANEOUS | 1 refills | Status: AC

## 2020-08-14 NOTE — Progress Notes (Signed)
Kristy Ross DOB: 23-Nov-1990 Encounter date: 08/14/2020  This is a 30 y.o. female who presents for complete physical   History of present illness/Additional concerns:  Just completed evaluation for breast mass; turned out to be fat necrosis. From initial visit to result was a month; so that was stressful.   Prefers to hold off on pap today; due in fall.   HTN: HCTZ 25, irbesartan 75mg  - has been good at home  Asthma/allergies: maintaining with nasal spray and zyrtec - if she runs out of nasal spray; then breathing worsens. Able to avoid flovent use with this. Hasn't used albuterol inhaler in months.   Mood has been good overall.       Past Medical History:  Diagnosis Date  . Allergy   . Asthma   . Blood in stool   . BMI 40.0-44.9, adult (HCC) 12/04/2015  . Depression   . History of chicken pox   . Hypertension   . IBS (irritable bowel syndrome)    No past surgical history on file. Allergies  Allergen Reactions  . Bee Venom Anaphylaxis  . Avocado Other (See Comments)    Stomach pain, flu like symptoms  . Niacin And Related Hives  . Milk-Related Compounds Rash   Current Meds  Medication Sig  . betamethasone dipropionate 0.05 % cream Apply topically 2 (two) times daily.   Social History   Tobacco Use  . Smoking status: Never Smoker  . Smokeless tobacco: Never Used  Substance Use Topics  . Alcohol use: No    Alcohol/week: 0.0 standard drinks    Comment: rare   Family History  Problem Relation Age of Onset  . Hypertension Father   . Heart disease Father   . Breast cancer Mother 18  . Hyperlipidemia Maternal Grandfather   . CVA Maternal Grandfather 41  . Hyperlipidemia Paternal Grandmother   . Hyperlipidemia Paternal Grandfather   . Stroke Paternal Grandfather 2  . High blood pressure Brother   . High Cholesterol Brother   . Other Maternal Grandmother        brain tumor; not cancer     Review of Systems  Constitutional: Negative for activity change,  appetite change, chills, fatigue, fever and unexpected weight change.  HENT: Negative for congestion, ear pain, hearing loss, sinus pressure, sinus pain, sore throat and trouble swallowing.   Eyes: Negative for pain and visual disturbance.  Respiratory: Negative for cough, chest tightness, shortness of breath and wheezing.   Cardiovascular: Negative for chest pain, palpitations and leg swelling.  Gastrointestinal: Negative for abdominal pain, blood in stool, constipation, diarrhea, nausea and vomiting.  Genitourinary: Negative for difficulty urinating and menstrual problem.  Musculoskeletal: Negative for arthralgias and back pain.  Skin: Negative for rash.  Neurological: Negative for dizziness, weakness, numbness and headaches.  Hematological: Negative for adenopathy. Does not bruise/bleed easily.  Psychiatric/Behavioral: Negative for sleep disturbance and suicidal ideas. The patient is not nervous/anxious.     CBC:  Lab Results  Component Value Date   WBC 7.7 09/09/2019   HGB 14.7 09/09/2019   HCT 42.3 09/09/2019   MCHC 34.8 09/09/2019   RDW 12.7 09/09/2019   PLT 336.0 09/09/2019   CMP: Lab Results  Component Value Date   NA 134 (L) 09/09/2019   K 3.8 09/09/2019   CL 99 09/09/2019   CO2 27 09/09/2019   GLUCOSE 87 09/09/2019   BUN 14 09/09/2019   CREATININE 0.76 09/09/2019   CALCIUM 9.4 09/09/2019   PROT 7.0 09/09/2019   BILITOT  0.5 09/09/2019   ALKPHOS 68 09/09/2019   ALT 13 09/09/2019   AST 17 09/09/2019   LIPID: Lab Results  Component Value Date   CHOL 160 03/30/2018   TRIG 129.0 03/30/2018   HDL 45.50 03/30/2018   LDLCALC 89 03/30/2018    Objective:  BP 108/80 (BP Location: Left Arm, Patient Position: Sitting, Cuff Size: Large)   Pulse 80   Temp 98.2 F (36.8 C) (Oral)   Ht 5' 2.5" (1.588 m)   Wt 242 lb 8 oz (110 kg)   LMP 08/08/2020   SpO2 98%   BMI 43.65 kg/m   Weight: 242 lb 8 oz (110 kg)   BP Readings from Last 3 Encounters:  08/14/20 108/80   07/07/20 102/78  02/14/20 100/80   Wt Readings from Last 3 Encounters:  08/14/20 242 lb 8 oz (110 kg)  07/07/20 238 lb 12.8 oz (108.3 kg)  02/14/20 243 lb 4.8 oz (110.4 kg)    Physical Exam Constitutional:      General: She is not in acute distress.    Appearance: She is well-developed.  HENT:     Head: Normocephalic and atraumatic.     Right Ear: External ear normal.     Left Ear: External ear normal.     Mouth/Throat:     Pharynx: No oropharyngeal exudate.  Eyes:     Conjunctiva/sclera: Conjunctivae normal.     Pupils: Pupils are equal, round, and reactive to light.  Neck:     Thyroid: No thyromegaly.  Cardiovascular:     Rate and Rhythm: Normal rate and regular rhythm.     Heart sounds: Normal heart sounds. No murmur heard. No friction rub. No gallop.   Pulmonary:     Effort: Pulmonary effort is normal.     Breath sounds: Normal breath sounds.  Abdominal:     General: Bowel sounds are normal. There is no distension.     Palpations: Abdomen is soft. There is no mass.     Tenderness: There is no abdominal tenderness. There is no guarding.     Hernia: No hernia is present.  Musculoskeletal:        General: No tenderness or deformity. Normal range of motion.     Cervical back: Normal range of motion and neck supple.  Lymphadenopathy:     Cervical: No cervical adenopathy.  Skin:    General: Skin is warm and dry.     Findings: No rash.     Comments: Eczema finger tips, toes - papular - dyshydrotic in appearance.   Neurological:     Mental Status: She is alert and oriented to person, place, and time.     Deep Tendon Reflexes: Reflexes normal.     Reflex Scores:      Tricep reflexes are 2+ on the right side and 2+ on the left side.      Bicep reflexes are 2+ on the right side and 2+ on the left side.      Brachioradialis reflexes are 2+ on the right side and 2+ on the left side.      Patellar reflexes are 2+ on the right side and 2+ on the left side. Psychiatric:         Speech: Speech normal.        Behavior: Behavior normal.        Thought Content: Thought content normal.     Assessment/Plan: Health Maintenance Due  Topic Date Due  . Pneumococcal Vaccine 52-39 Years old (1  of 2 - PPSV23) Never done   Health Maintenance reviewed.  1. Preventative health care Keep up with regular exercise. Discussed prevnar 20; she will consider for future visit.   2. Essential hypertension Well controlled with current medications. Continue these. - CBC with Differential/Platelet; Future - Comprehensive metabolic panel; Future  3. Mild intermittent asthma without complication Well controlled; continue with flonase, zyrtect.   4. Axillary lymphadenopathy It was recommended for repeat US in 02/2021. Order placed today. - US BREAST COMPLETE UNI RIGHT INC AXILLA; Future  5. Lipid screening Well controlled with diet, exercise. Will recheck today.  - Lipid panel; Future  6. Dyshidrotic eczema She was not getting improvement with triamcinolone; we are going to step up to betamethasone; discussed moisturizing well and limit use.     Return in about 6 months (around 02/13/2021) for Chronic condition visit. we will do pap at that time. She is not sexually active and is low risk with hx of normal paps.   Theodis Shove, MD

## 2020-08-22 ENCOUNTER — Other Ambulatory Visit: Payer: Self-pay

## 2020-08-22 ENCOUNTER — Encounter: Payer: 59 | Attending: Family Medicine | Admitting: Dietician

## 2020-08-22 ENCOUNTER — Encounter: Payer: Self-pay | Admitting: Dietician

## 2020-08-22 DIAGNOSIS — E669 Obesity, unspecified: Secondary | ICD-10-CM | POA: Insufficient documentation

## 2020-08-22 NOTE — Patient Instructions (Addendum)
Begin incorporating the Mediterranean Diet into your daily life. Use your handout for helpful information about foods to incorporate.  Expect slip ups to happen! You are human and these things happen. Great job on being able to look forward, be solution oriented and get back on track as fast as possible.  Try a lentil recipe of your choosing. Remember to cook them "low and slow" until they are nice and soft. Try "Lonzo Cloud" or

## 2020-08-22 NOTE — Progress Notes (Signed)
Medical Nutrition Therapy:  Appt start time: 0800 end time:  0840.  Pt recently had a breast cancer scare that they state got them off track for a while, but was found to be negative. Pt states this woke them up and they have made some changes. Pt has limited their alcohol consumption, and is interested in Mediterranean Diet. Pt reports having HTN and a family history of CVD. This also has made them want to purse the Med Diet. Pt reports minimal GI distress anymore. Pt has been able to eliminate most foods that cause lower GI symptoms. Pt is making homemade hummus and tried a steak on Mother's Day. Pt has been working on communicating with their new exercise group, states it has improved their relationship. Pt has been swimming more, and will be playing more tennis in a weekly doubles league over the summer. Pt reports difficulty letting go of mistakes, will beat themselves up over it. Pt wants to be able to get past this. Pt is in therapy and states it is helpful.  Assessment:  Primary concerns today: Healthy eating, lifestyle/behavior change   Preferred Learning Style:  No preference indicated   Learning Readiness:  Ready   MEDICATIONS: Hydrochlorothiazide, Avapro, NEW: Prozac   DIETARY INTAKE:  Usual eating pattern includes 3 meals and 1 snacks per day.  Everyday foods include eggs   Avoided foods include avocado, dairy. Spicy foods    24-hr recall:   B (8 AM): Oatmeal with peanut butter, strawberries, maple syrup and cinnamon, splash of soymilk, 1 cup of coffee with oat milk Snk ( AM): none L ( PM): Leftover shredded chicken in salsa, with tortilla chips Snk ( PM): Smoothie with PB, vegan protein powder, mixed berries, and soy milk D ( PM):  Whole wheat penne, spinach, kale pesto, rotisserie chicken Snk ( PM):  none Beverages: ~92 oz. Water, 8 oz coffee.  Usual physical activity: Tennis once a week, walks occasionally   Progress Towards Goal(s):  In progress.   Nutritional  Diagnosis:  NB-1.1 Food and nutrition-related knowledge deficit As related to food allergies.  As evidenced by persistent GI discomfort and recurring episodes of food allergy symptoms.    Intervention:  Nutrition Education.  Counsel patient on the difficulty of living with a food allergy and the persistent symptoms. Assure patient that the focus of MNT is to address and work to relieve her symptoms. Educate patient on the potential role of certain dietary fibers in IBS and GI distress. Recommend patient continue to take a detailed food and symptom log to help better identify potential causes of allergic response and/or GI distress. Reinforce the positive effect of intuitive eating on patient's health. Counsel patient on focusing on the totality of health, including mental and emotional health. Educate patient on the potential impact of her new medication on her appetite and weight. Encourage patient to trust in her dietitian as a source of accurate, evidence-based nutrition information. Educate patient on the balanced plate eating model, and how it can positively affect her health.   NEW: Educate patient on inulin, it's role in bloating/GI discomfort, and foods that contain it. Advise patient on preparing pre-portioned food options to satisfy cravings, in order to avoid restricting/binging.  NEW: Educate patient on the role of the Mediterranean Diet in their health concerns. This includes it's heart healthy components of nuts/seeds, plant-based oils, lean proteins and seafood, and high amounts of colorful fruits and vegetables.  Goals: Begin incorporating the Mediterranean Diet into your daily life. Use  your handout for helpful information about foods to incorporate. Expect slip ups to happen! You are human and these things happen. Great job on being able to look forward, be solution oriented and get back on track as fast as possible. Try a lentil recipe of your choosing. Remember to cook them "low  and slow" until they are nice and soft. Try "Patrecia Pour" or and any other middle eastern lentil stew!   Teaching Method Utilized:  Visual Auditory  Handouts given: Inulin containing foods NEW: Mediterranean Diet Nutrition Care Manual   Barriers to learning/adherence to lifestyle change: None  Demonstrated degree of understanding via:  Teach Back   Monitoring/Evaluation:  Dietary intake, GI symptoms, and changes in appetite  log in 3 month(s).

## 2020-10-03 ENCOUNTER — Telehealth: Payer: 59 | Admitting: Family Medicine

## 2020-10-03 ENCOUNTER — Encounter: Payer: Self-pay | Admitting: Family Medicine

## 2020-10-03 ENCOUNTER — Telehealth: Payer: Self-pay | Admitting: Family Medicine

## 2020-10-03 VITALS — Temp 100.0°F | Ht 62.5 in

## 2020-10-03 DIAGNOSIS — J4520 Mild intermittent asthma, uncomplicated: Secondary | ICD-10-CM

## 2020-10-03 DIAGNOSIS — U071 COVID-19: Secondary | ICD-10-CM | POA: Diagnosis not present

## 2020-10-03 DIAGNOSIS — R059 Cough, unspecified: Secondary | ICD-10-CM

## 2020-10-03 MED ORDER — PROAIR HFA 108 (90 BASE) MCG/ACT IN AERS: INHALATION_SPRAY | RESPIRATORY_TRACT | 1 refills | Status: DC

## 2020-10-03 MED ORDER — MOLNUPIRAVIR EUA 200MG CAPSULE: 4.0000 | ORAL_CAPSULE | Freq: Two times a day (BID) | ORAL | 0 refills | Status: AC

## 2020-10-03 MED ORDER — FLUTICASONE PROPIONATE HFA 44 MCG/ACT IN AERO: INHALATION_SPRAY | RESPIRATORY_TRACT | 2 refills | Status: DC

## 2020-10-03 MED ORDER — BENZONATATE 100 MG PO CAPS: 200.0000 mg | ORAL_CAPSULE | Freq: Two times a day (BID) | ORAL | 0 refills | Status: AC | PRN

## 2020-10-03 NOTE — Progress Notes (Addendum)
MyChart Video Visit  Virtual Visit via Video Note   This visit type was conducted due to national recommendations for restrictions regarding the COVID-19 Pandemic (e.g. social distancing) in an effort to limit this patient's exposure and mitigate transmission in our community. This patient is at least at moderate risk for complications without adequate follow up. This format is felt to be most appropriate for this patient at this time. Physical exam was limited by quality of the video and audio technology used for the visit.   Patient location: home Provider location: office  I discussed the limitations of evaluation and management by telemedicine and the availability of in person appointments. The patient expressed understanding and agreed to proceed.  Patient: Kristy Ross   DOB: [birth year]   30 y.o. Female  MRN: [id number] Visit Date: 10/03/2020  Today's healthcare provider: Betty Swaziland, MD   Chief Complaint  Patient presents with   Covid Positive     Kristy Ross is a 30 y.o. female with history of asthma, depression, hypertension, and obesity who tested positive for the Coronavirus on 10/02/20. She has been symptomatic for about 2 days. Fever, chills, body aches, headache,fatigue, nasal congestion, sore throat, rhinorrhea, nonproductive cough, mild intermittent wheezing, abdominal cramps. Vomiting x 1, posttussive. Negative for anosmia, ageusia, CP, palpitations, dyspnea, nausea, changes in bowel habits, urinary symptoms, or skin rash.  Symptoms are stable.  She has been taking Tylenol. No known sick contact. COVID-19 vaccination completed + booster x1.  She does not have albuterol inhaler at home, currently she is using Flovent 44 mcg bid.  Patient Active Problem List   Diagnosis Date Noted   Eczema 09/08/2019   Anxiety 06/04/2019   Cervical polyp 11/27/2017   MDD (major depressive disorder), recurrent episode (HCC) 01/13/2017   BMI 40.0-44.9, adult (HCC) 12/04/2015    Mild intermittent asthma 12/04/2015   Seasonal allergies 12/04/2015   Essential hypertension 12/04/2015   Social History   Tobacco Use   Smoking status: Never   Smokeless tobacco: Never  Substance Use Topics   Alcohol use: No    Alcohol/week: 0.0 standard drinks    Comment: rare   Drug use: No   Allergies  Allergen Reactions   Bee Venom Anaphylaxis   Avocado Other (See Comments)    Stomach pain, flu like symptoms   Niacin And Related Hives   Milk-Related Compounds Rash   Medications: Outpatient Medications Prior to Visit  Medication Sig   betamethasone dipropionate 0.05 % cream Apply topically 2 (two) times daily.   cetirizine (ZYRTEC) 10 MG tablet Take 10 mg by mouth daily.   Cholecalciferol (VITAMIN D3 PO) Take 2,000 Units by mouth daily.   Ferrous Sulfate (IRON PO) Take 25 mg by mouth. Solgar brand   FLUoxetine (PROZAC) 10 MG capsule TAKE 1 CAPSULE BY MOUTH EVERY DAY   fluticasone (FLONASE) 50 MCG/ACT nasal spray Place into both nostrils as needed.    fluticasone (FLOVENT HFA) 44 MCG/ACT inhaler INHALE 1 PUFF BY MOUTH TWICE A DAY   hydrochlorothiazide (HYDRODIURIL) 25 MG tablet Take 1 tablet (25 mg total) by mouth daily.   irbesartan (AVAPRO) 75 MG tablet Take 1 tablet (75 mg total) by mouth daily.   Probiotic Product (PROBIOTIC PO) Take by mouth daily.   tizanidine (ZANAFLEX) 2 MG capsule Take 1 capsule (2 mg total) by mouth 3 (three) times daily as needed for muscle spasms.   [DISCONTINUED] PROAIR HFA 108 (90 Base) MCG/ACT inhaler INHALE 1 PUFF INTO THE LUNGS EVERY 6 (SIX)  HOURS AS NEEDED FOR WHEEZING OR SHORTNESS OF BREATH.   No facility-administered medications prior to visit.   Review of Systems See pertinent positives and negatives per HPI.    Temp 100 F (37.8 C)   Ht 5' 2.5" (1.588 m)   LMP 09/19/2020   BMI 43.65 kg/m   Physical Exam   GENERAL: alert, oriented, appears well and in no acute distress  HEENT: atraumatic, conjunttiva clear, no obvious  abnormalities on inspection of external nose and ears  NECK: normal movements of the head and neck Nasal congestion.  LUNGS: on inspection no signs of respiratory distress, breathing rate appears normal, no obvious gross SOB, gasping or wheezing  CV: no obvious cyanosis  MS: moves all visible extremities without noticeable abnormality  PSYCH/NEURO: pleasant and cooperative, no obvious depression or anxiety, speech and thought processing grossly intact   Assessment & Plan     [name] was seen on video today for covid positive.  Diagnoses and all orders for this visit:  COVID-19 virus infection We discussed diagnosis, possible complications, and treatment options. She has some mild to moderate disease with risk of complications due to hypertension, obesity, and asthma. She agrees with starting antiviral medication, will reviewed side effects. 7 days quarantine starting from symptoms onset. Adequate hydration, Tylenol 500 mg 3-4 times per day as needed, and rest. Throat lozenges may help with sore throat. Instructed about warning signs.  -     molnupiravir EUA 200 mg CAPS; Take 4 capsules (800 mg total) by mouth 2 (two) times daily for 5 days.  Mild intermittent asthma without complication She is having some wheezing, so recommend albuterol inhaler 2 puff every 6 hours around-the-clock for 7 days, then as needed every 4-6 hours. Continue Flovent 44 mcg twice daily.  -     PROAIR HFA 108 (90 Base) MCG/ACT inhaler; INHALE 1 PUFF INTO THE LUNGS EVERY 6 (SIX) HOURS AS NEEDED FOR WHEEZING OR SHORTNESS OF BREATH.  Cough Explained that cough and congestion can last a few days and even weeks after acute symptoms have resolved. Benzonatate for symptomatic treatment. I do not think imaging is needed at this time. Plain Mucinex may help once cough becomes more productive.  -     benzonatate (TESSALON) 100 MG capsule; Take 2 capsules (200 mg total) by mouth 2 (two) times daily as needed  for up to 10 days.  Return if symptoms worsen or fail to improve.    We discussed possible serious and likely etiologies, options for evaluation and workup, limitations of telemedicine visit vs in person visit, treatment, treatment risks and precautions.  The patient was advised to call back or seek an in-person evaluation if the symptoms worsen or if the condition fails to improve as anticipated.  I discussed the assessment and treatment plan with the patient. Ms. Bruster was provided an opportunity to ask questions and all were answered. She agreed with the plan and demonstrated an understanding of the instructions.  Betty Swaziland, MD [facility] [place] [phone] (phone) [phone] (fax)  [facility]

## 2020-10-03 NOTE — Telephone Encounter (Signed)
Rx done. 

## 2020-10-03 NOTE — Telephone Encounter (Signed)
Pt is calling in needing a refill on Rx PROAIR HFA 108 inhaler  Pharm:  [facility] [employer] on [address]

## 2020-12-05 ENCOUNTER — Ambulatory Visit: Payer: 59 | Admitting: Dietician

## 2021-01-27 ENCOUNTER — Other Ambulatory Visit: Payer: Self-pay | Admitting: Family Medicine

## 2021-02-09 ENCOUNTER — Ambulatory Visit
Admission: RE | Admit: 2021-02-09 | Discharge: 2021-02-09 | Disposition: A | Discharge status: Home / Self Care | Payer: 59 | Source: Ambulatory Visit | Attending: Family Medicine | Admitting: Family Medicine

## 2021-02-09 ENCOUNTER — Other Ambulatory Visit: Payer: Self-pay | Admitting: Family Medicine

## 2021-02-09 DIAGNOSIS — R590 Localized enlarged lymph nodes: Secondary | ICD-10-CM

## 2021-02-14 ENCOUNTER — Ambulatory Visit: Payer: 59 | Admitting: Family Medicine

## 2021-02-14 ENCOUNTER — Encounter: Payer: Self-pay | Admitting: Family Medicine

## 2021-02-14 ENCOUNTER — Other Ambulatory Visit (HOSPITAL_COMMUNITY)
Admission: RE | Admit: 2021-02-14 | Discharge: 2021-02-14 | Disposition: A | Discharge status: Home / Self Care | Payer: 59 | Source: Ambulatory Visit | Attending: Family Medicine | Admitting: Family Medicine

## 2021-02-14 VITALS — BP 118/80 | HR 100 | Temp 98.3°F | Ht 62.5 in | Wt 249.0 lb

## 2021-02-14 DIAGNOSIS — I10 Essential (primary) hypertension: Secondary | ICD-10-CM

## 2021-02-14 DIAGNOSIS — Z124 Encounter for screening for malignant neoplasm of cervix: Secondary | ICD-10-CM

## 2021-02-14 DIAGNOSIS — J4520 Mild intermittent asthma, uncomplicated: Secondary | ICD-10-CM

## 2021-02-14 DIAGNOSIS — Z23 Encounter for immunization: Secondary | ICD-10-CM

## 2021-02-14 DIAGNOSIS — J302 Other seasonal allergic rhinitis: Secondary | ICD-10-CM | POA: Diagnosis not present

## 2021-02-14 NOTE — Progress Notes (Signed)
Kristy Ross DOB: [birth year] Encounter date: 02/14/2021  This is a 30 y.o. female who presents with Chief Complaint  Patient presents with  . Follow-up    History of present illness: Did have covid in July. States that it was rough; stayed testing positive for awhile. Cough wasn't too bad, but was hard to breathe. Hair loss was significant. She has gotten flu shot already.    She did just complete axillary [place] for lymph node; no significant change; probably benign; she will repeat in 6 months.   Last visit with me was 08/2020 for physical.   HTN: hctz 25, irbesartan 75mg . Not checking at home. Does take meds regularly. Only a few times has noted light headed with getting up too quickly, but no issues with yoga/bending forward, etc.   Asthma/allergies: using flonase, flovent regularly. Taking zyrtec regularly.  Allergies  Allergen Reactions  . Bee Venom Anaphylaxis  . Avocado Other (See Comments)    Stomach pain, flu like symptoms  . Niacin And Related Hives  . Milk-Related Compounds Rash   Current Meds  Medication Sig  . betamethasone dipropionate 0.05 % cream Apply topically 2 (two) times daily.  . cetirizine (ZYRTEC) 10 MG tablet Take 10 mg by mouth daily.  . Cholecalciferol (VITAMIN D3 PO) Take 2,000 Units by mouth daily.  FLUoxetine (PROZAC) 10 MG capsule TAKE 1 CAPSULE BY MOUTH EVERY DAY  . fluticasone (FLONASE) 50 MCG/ACT nasal spray Place into both nostrils as needed.   . fluticasone (FLOVENT HFA) 44 MCG/ACT inhaler INHALE 1 PUFF BY MOUTH TWICE A DAY  . hydrochlorothiazide (HYDRODIURIL) 25 MG tablet Take 1 tablet (25 mg total) by mouth daily.  . irbesartan (AVAPRO) 75 MG tablet Take 1 tablet (75 mg total) by mouth daily.  Marland Kitchen PROAIR HFA 108 (90 Base) MCG/ACT inhaler INHALE 1 PUFF INTO THE LUNGS EVERY 6 (SIX) HOURS AS NEEDED FOR WHEEZING OR SHORTNESS OF BREATH.    Review of Systems  Constitutional:  Negative for chills, fatigue and fever.  Respiratory:  Negative for  cough, chest tightness, shortness of breath and wheezing.   Cardiovascular:  Negative for chest pain, palpitations and leg swelling.   Objective:  BP 118/80 (BP Location: Left Arm, Patient Position: Sitting, Cuff Size: Large)   Pulse 100   Temp 98.3 F (36.8 C) (Oral)   Ht 5' 2.5" (1.588 m)   Wt 249 lb (112.9 kg)   LMP 02/08/2021 (Exact Date)   SpO2 97%   BMI 44.82 kg/m   Weight: 249 lb (112.9 kg)   BP Readings from Last 3 Encounters:  02/14/21 118/80  08/14/20 108/80  07/07/20 102/78   Wt Readings from Last 3 Encounters:  02/14/21 249 lb (112.9 kg)  08/14/20 242 lb 8 oz (110 kg)  07/07/20 238 lb 12.8 oz (108.3 kg)    Physical Exam Constitutional:      General: She is not in acute distress.    Appearance: She is well-developed.  Cardiovascular:     Rate and Rhythm: Normal rate and regular rhythm.     Heart sounds: Normal heart sounds. No murmur heard.   No friction rub.  Pulmonary:     Effort: Pulmonary effort is normal. No respiratory distress.     Breath sounds: Normal breath sounds. No wheezing or rales.  Musculoskeletal:     Right lower leg: No edema.     Left lower leg: No edema.  Neurological:     Mental Status: She is alert and oriented to person, place,  and time.  Psychiatric:        Behavior: Behavior normal.    Assessment/Plan  1. Essential hypertension Well controlled with current medications. We will keep these.   2. Mild intermittent asthma without complication Continue with flovent, albuterol prn and allergy control.   3. Seasonal allergies Continue with flonase, zyrtec. Referral to allergy.  4. Need for pneumococcal vaccination Given prevnar 20 today.  5. Cervical cancer screening   Return in about 6 months (around 08/15/2021) for physical exam.       Theodis Shove, MD

## 2021-02-16 LAB — CYTOLOGY - PAP
Comment: NEGATIVE
Diagnosis: UNDETERMINED — AB
High risk HPV: NEGATIVE

## 2021-02-22 ENCOUNTER — Other Ambulatory Visit: Payer: Self-pay | Admitting: Family Medicine

## 2021-04-10 ENCOUNTER — Other Ambulatory Visit: Payer: Self-pay

## 2021-04-10 ENCOUNTER — Encounter: Payer: Self-pay | Admitting: Allergy & Immunology

## 2021-04-10 ENCOUNTER — Ambulatory Visit (INDEPENDENT_AMBULATORY_CARE_PROVIDER_SITE_OTHER): Payer: 59 | Admitting: Allergy & Immunology

## 2021-04-10 VITALS — BP 124/82 | HR 83 | Temp 98.0°F | Resp 18 | Ht 62.5 in | Wt 248.0 lb

## 2021-04-10 DIAGNOSIS — J310 Chronic rhinitis: Secondary | ICD-10-CM

## 2021-04-10 DIAGNOSIS — T7800XA Anaphylactic reaction due to unspecified food, initial encounter: Secondary | ICD-10-CM

## 2021-04-10 DIAGNOSIS — T7800XD Anaphylactic reaction due to unspecified food, subsequent encounter: Secondary | ICD-10-CM

## 2021-04-10 DIAGNOSIS — L2089 Other atopic dermatitis: Secondary | ICD-10-CM

## 2021-04-10 DIAGNOSIS — J302 Other seasonal allergic rhinitis: Secondary | ICD-10-CM

## 2021-04-10 DIAGNOSIS — J3089 Other allergic rhinitis: Secondary | ICD-10-CM

## 2021-04-10 MED ORDER — AZELASTINE HCL 0.15 % NA SOLN: NASAL | 5 refills | Status: DC

## 2021-04-10 MED ORDER — FEXOFENADINE HCL 180 MG PO TABS: 180.0000 mg | ORAL_TABLET | Freq: Every day | ORAL | 5 refills | Status: DC

## 2021-04-10 MED ORDER — FLUTICASONE PROPIONATE 50 MCG/ACT NA SUSP: 2.0000 | Freq: Every day | NASAL | 5 refills | Status: DC

## 2021-04-10 NOTE — Patient Instructions (Addendum)
1. Chronic rhinitis - Testing today showed: grasses, ragweed, weeds, trees, indoor molds, outdoor molds, dust mites, cat, and dog as well as horses  - Copy of test results provided.  - Avoidance measures provided. - Stop taking: Zyrtec - Continue with: Flonase (fluticasone) two sprays per nostril daily (AIM FOR EAR ON EACH SIDE) - Start taking: Allegra (fexofenadine) 180mg  tablet once daily and Astelin (azelastine) 2 sprays per nostril 1-2 times daily as needed - You can use an extra dose of the antihistamine, if needed, for breakthrough symptoms.  - Consider nasal saline rinses 1-2 times daily to remove allergens from the nasal cavities as well as help with mucous clearance (this is especially helpful to do before the nasal sprays are given) - Consider allergy shots as a means of long-term control. - Allergy shots "re-train" and "reset" the immune system to ignore environmental allergens and decrease the resulting immune response to those allergens (sneezing, itchy watery eyes, runny nose, nasal congestion, etc).    - Allergy shots improve symptoms in 75-85% of patients.  - We can discuss more at the next appointment if the medications are not working for you.  2. Flexural atopic dermatitis - We are not going to change to a more mild anti inflammatory that is also safe to use on your face if you develop worsening rashes with introduction of milk products.  - Sample provided. 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 - Copy of testing results provided. - There is a the low positive predictive value of food allergy testing and hence the high possibility of false positives. - In contrast, food allergy testing has a high negative predictive value, therefore if testing is negative we can be relatively assured that they are indeed negative.  - I would introduce milk and prescription forms as well as anything that has milk on the  label. - I would continue to avoid cows milk, cheese, and yogurt (ie major sources of dairy). - We are holding off on an EpiPen.  4. Return in about 3 months (around 07/08/2021).    Please inform 07/10/2021 of any Emergency Department visits, hospitalizations, or changes in symptoms. Call us before going to the ED for breathing or allergy symptoms since we might be able to fit you in for a sick visit. Feel free to contact us anytime with any questions, problems, or concerns.  It was a pleasure to meet you today!  Websites that have reliable patient information: 1. American Academy of Asthma, Allergy, and Immunology: www.aaaai.org 2. Food Allergy Research and Education (FARE): foodallergy.org 3. Mothers of Asthmatics: http://www.asthmacommunitynetwork.org 4. American College of Allergy, Asthma, and Immunology: www.acaai.org   COVID-19 Vaccine Information can be found at: [place] For questions related to vaccine distribution or appointments, please email [email or web address] or call [phone].   We realize that you might be concerned about having an allergic reaction to the COVID19 vaccines. To help with that concern, WE ARE OFFERING THE COVID19 VACCINES IN OUR OFFICE! Ask the front desk for dates!     Like [phone] on [place] and Instagram for our latest updates!      A healthy democracy works best when [employer] participate! Make sure you are registered to vote! If you have moved or changed any of your contact information, you will need to get this updated before voting!  In some cases, you MAY be able to register to vote online: [employer]  Airborne Adult Perc - 04/10/21 0927     Time Antigen Placed [id number]    Allergen Manufacturer Waynette ButteryGreer    Location Back    Number of Test 59    1. Control-Buffer 50% Glycerol Negative    2. Control-Histamine 1 mg/ml 2+    3. Albumin saline  Negative    4. Bahia Negative    5. [place] Negative    6. Johnson Negative    7. Kentucky Blue Negative    8. Meadow Fescue Negative    9. Perennial Rye Negative    10. Sweet Vernal Negative    11. Timothy Negative    12. Cocklebur Negative    13. Burweed Marshelder Negative    14. Ragweed, short Negative    15. Ragweed, Giant Negative    16. Plantain,  English Negative    17. Lamb's Quarters Negative    18. Sheep Sorrell Negative    19. Rough Pigweed Negative    20. Marsh Elder, Rough Negative    21. Mugwort, Common Negative    22. Ash mix Negative    23. Birch mix Negative    24. Beech American Negative    25. Box, Elder Negative    26. Cedar, red Negative    27. Cottonwood, Guinea-Biss[place]astern Negative    28. Elm mix Negative    29. Hickory Negative    30. Maple mix Negative    31. Oak, Guinea[place] mix Negative    32. Pecan Pollen Negative    33. Pine mix Negative    34. Sycamore Eastern Negative    35. Walnut, Black Pollen Negative    36. Alternaria alternata Negative    37. Cladosporium Herbarum Negative    38. Aspergillus mix Negative    39. Penicillium mix Negative    40. Bipolaris sorokiniana (Helminthosporium) Negative    41. Drechslera spicifera (Curvularia) Negative    42. Mucor plumbeus Negative    43. Fusarium moniliforme Negative    44. Aureobasidium pullulans (pullulara) Negative    45. Rhizopus oryzae Negative    46. Botrytis cinera Negative    47. Epicoccum nigrum Negative    48. Phoma betae Negative    49. Candida Albicans Negative    50. Trichophyton mentagrophytes Negative    51. Mite, D Farinae  5,000 AU/ml Negative    52. Mite, D Pteronyssinus  5,000 AU/ml Negative    53. Cat Hair 10,000 BAU/ml Negative    54.  Dog Epithelia Negative    55. Mixed Feathers Negative    56. Horse Epithelia 2+    57. Cockroach, German Negative    58. Mouse Negative    59. Tobacco Leaf Negative             Intradermal - 04/10/21 1012     Time Antigen Placed 1000     Allergen Manufacturer Greer    Location Arm    Number of Test 15    Control Negative    [place] Negative    Johnson Negative    7 Grass 4+    Ragweed mix 2+    Weed mix Negative    Tree mix 3+    Mold 1 2+    Mold 2 3+    Mold 3 Negative    Mold 4 Negative    Cat 3+    Dog 3+    Cockroach Negative    Mite mix 4+             Food Adult  Perc - 04/10/21 0900     Time Antigen Placed [id number]    Allergen Manufacturer Waynette ButteryGreer    Location Back    Number of allergen test 22    1. Peanut Negative    2. Soybean Negative    3. Wheat Negative    4. Sesame Negative    5. Milk, cow Negative    6. Egg White, Chicken Negative    7. Casein Negative    8. Shellfish Mix Negative    9. Fish Mix Negative    10. Cashew Negative    11. Pecan Food Negative    12. Walnut Food Negative    13. Almond Negative    14. Hazelnut Negative    15. [place] nut Negative    16. Coconut Negative    17. Pistachio Negative    42. Tomato Negative    58. Apple Negative    63. Pineapple Negative    69. Ginger Negative    70. Garlic Negative             Reducing Pollen Exposure  The American Academy of Allergy, Asthma and Immunology suggests the following steps to reduce your exposure to pollen during allergy seasons.    Do not hang sheets or clothing out to dry; pollen may collect on these items. Do not mow lawns or spend time around freshly cut grass; mowing stirs up pollen. Keep windows closed at night.  Keep car windows closed while driving. Minimize morning activities outdoors, a time when pollen counts are usually at their highest. Stay indoors as much as possible when pollen counts or humidity is high and on windy days when pollen tends to remain in the air longer. Use air conditioning when possible.  Many air conditioners have filters that trap the pollen spores. Use a HEPA room air filter to remove pollen form the indoor air you breathe.  Control of Mold Allergen   Mold and fungi can  grow on a variety of surfaces provided certain temperature and moisture conditions exist.  Outdoor molds grow on plants, decaying vegetation and soil.  The major outdoor mold, Alternaria and Cladosporium, are found in very high numbers during hot and dry conditions.  Generally, a late Summer - Fall peak is seen for common outdoor fungal spores.  Rain will temporarily lower outdoor mold spore count, but counts rise rapidly when the rainy period ends.  The most important indoor molds are Aspergillus and Penicillium.  Dark, humid and poorly ventilated basements are ideal sites for mold growth.  The next most common sites of mold growth are the bathroom and the kitchen.  Outdoor (Seasonal) Mold Control  Positive outdoor molds via skin testing: Alternaria and Cladosporium  Use air conditioning and keep windows closed Avoid exposure to decaying vegetation. Avoid leaf raking. Avoid grain handling. Consider wearing a face mask if working in moldy areas.    Indoor (Perennial) Mold Control   Positive indoor molds via skin testing: Aspergillus and Penicillium  Maintain humidity below 50%. Clean washable surfaces with 5% bleach solution. Remove sources e.g. contaminated carpets.    Control of Dog or Cat Allergen  Avoidance is the best way to manage a dog or cat allergy. If you have a dog or cat and are allergic to dog or cats, consider removing the dog or cat from the home. If you have a dog or cat but dont want to find it a new home, or if your family wants a pet even though someone in  the household is allergic, here are some strategies that may help keep symptoms at bay:  Keep the pet out of your bedroom and restrict it to only a few rooms. Be advised that keeping the dog or cat in only one room will not limit the allergens to that room. Dont pet, hug or kiss the dog or cat; if you do, wash your hands with soap and water. High-efficiency particulate air (HEPA) cleaners run continuously in a  bedroom or living room can reduce allergen levels over time. Regular use of a high-efficiency vacuum cleaner or a central vacuum can reduce allergen levels. Giving your dog or cat a bath at least once a week can reduce airborne allergen.  Control of Dust Mite Allergen    Dust mites play a major role in allergic asthma and rhinitis.  They occur in environments with high humidity wherever human skin is found.  Dust mites absorb humidity from the atmosphere (ie, they do not drink) and feed on organic matter (including shed human and animal skin).  Dust mites are a microscopic type of insect that you cannot see with the naked eye.  High levels of dust mites have been detected from mattresses, pillows, carpets, upholstered furniture, bed covers, clothes, soft toys and any woven material.  The principal allergen of the dust mite is found in its feces.  A gram of dust may contain 1,000 mites and 250,000 fecal particles.  Mite antigen is easily measured in the air during house cleaning activities.  Dust mites do not bite and do not cause harm to humans, other than by triggering allergies/asthma.    Ways to decrease your exposure to dust mites in your home:  Encase mattresses, box springs and pillows with a mite-impermeable barrier or cover   Wash sheets, blankets and drapes weekly in hot water (130 F) with detergent and dry them in a dryer on the hot setting.  Have the room cleaned frequently with a vacuum cleaner and a damp dust-mop.  For carpeting or rugs, vacuuming with a vacuum cleaner equipped with a high-efficiency particulate air (HEPA) filter.  The dust mite allergic individual should not be in a room which is being cleaned and should wait 1 hour after cleaning before going into the room. Do not sleep on upholstered furniture (eg, couches).   If possible removing carpeting, upholstered furniture and drapery from the home is ideal.  Horizontal blinds should be eliminated in the rooms where the person  spends the most time (bedroom, study, television room).  Washable vinyl, roller-type shades are optimal. Remove all non-washable stuffed toys from the bedroom.  Wash stuffed toys weekly like sheets and blankets above.   Reduce indoor humidity to less than 50%.  Inexpensive humidity monitors can be purchased at most hardware stores.  Do not use a humidifier as can make the problem worse and are not recommended.  Allergy Shots   Allergies are the result of a chain reaction that starts in the immune system. Your immune system controls how your body defends itself. For instance, if you have an allergy to pollen, your immune system identifies pollen as an invader or allergen. Your immune system overreacts by producing antibodies called Immunoglobulin E (IgE). These antibodies travel to cells that release chemicals, causing an allergic reaction.  The concept behind allergy immunotherapy, whether it is received in the form of shots or tablets, is that the immune system can be desensitized to specific allergens that trigger allergy symptoms. Although it requires time and  patience, the payback can be long-term relief.  How Do Allergy Shots Work?  Allergy shots work much like a vaccine. Your body responds to injected amounts of a particular allergen given in increasing doses, eventually developing a resistance and tolerance to it. Allergy shots can lead to decreased, minimal or no allergy symptoms.  There generally are two phases: build-up and maintenance. Build-up often ranges from three to six months and involves receiving injections with increasing amounts of the allergens. The shots are typically given once or twice a week, though more rapid build-up schedules are sometimes used.  The maintenance phase begins when the most effective dose is reached. This dose is different for each person, depending on how allergic you are and your response to the build-up injections. Once the maintenance dose is reached,  there are longer periods between injections, typically two to four weeks.  Occasionally doctors give cortisone-type shots that can temporarily reduce allergy symptoms. These types of shots are different and should not be confused with allergy immunotherapy shots.  Who Can Be Treated with Allergy Shots?  Allergy shots may be a good treatment approach for people with allergic rhinitis (hay fever), allergic asthma, conjunctivitis (eye allergy) or stinging insect allergy.   Before deciding to begin allergy shots, you should consider:   The length of allergy season and the severity of your symptoms  Whether medications and/or changes to your environment can control your symptoms  Your desire to avoid long-term medication use  Time: allergy immunotherapy requires a major time commitment  Cost: may vary depending on your insurance coverage  Allergy shots for children age 39 and older are effective and often well tolerated. They might prevent the onset of new allergen sensitivities or the progression to asthma.  Allergy shots are not started on patients who are pregnant but can be continued on patients who become pregnant while receiving them. In some patients with other medical conditions or who take certain common medications, allergy shots may be of risk. It is important to mention other medications you talk to your allergist.   When Will I Feel Better?  Some may experience decreased allergy symptoms during the build-up phase. For others, it may take as long as 12 months on the maintenance dose. If there is no improvement after a year of maintenance, your allergist will discuss other treatment options with you.  If you arent responding to allergy shots, it may be because there is not enough dose of the allergen in your vaccine or there are missing allergens that were not identified during your allergy testing. Other reasons could be that there are high levels of the allergen in your environment  or major exposure to non-allergic triggers like tobacco smoke.  What Is the Length of Treatment?  Once the maintenance dose is reached, allergy shots are generally continued for three to five years. The decision to stop should be discussed with your allergist at that time. Some people may experience a permanent reduction of allergy symptoms. Others may relapse and a longer course of allergy shots can be considered.  What Are the Possible Reactions?  The two types of adverse reactions that can occur with allergy shots are local and systemic. Common local reactions include very mild redness and swelling at the injection site, which can happen immediately or several hours after. A systemic reaction, which is less common, affects the entire body or a particular body system. They are usually mild and typically respond quickly to medications. Signs include increased allergy  symptoms such as sneezing, a stuffy nose or hives.  Rarely, a serious systemic reaction called anaphylaxis can develop. Symptoms include swelling in the throat, wheezing, a feeling of tightness in the chest, nausea or dizziness. Most serious systemic reactions develop within 30 minutes of allergy shots. This is why it is strongly recommended you wait in your doctors office for 30 minutes after your injections. Your allergist is trained to watch for reactions, and his or her staff is trained and equipped with the proper medications to identify and treat them.  Who Should Administer Allergy Shots?  The preferred location for receiving shots is your prescribing allergists office. Injections can sometimes be given at another facility where the physician and staff are trained to recognize and treat reactions, and have received instructions by your prescribing allergist.

## 2021-04-10 NOTE — Progress Notes (Signed)
NEW PATIENT  Date of Service/Encounter:  04/10/21  Consult requested by: Kristy Macadam, MD   Assessment:   Perennial and seasonal allergic rhinitis (grasses, ragweed, weeds, trees, indoor molds, outdoor molds, dust mites, cat, and dog as well as horses)  Hand eczema   Anaphylactic shock due to food - with negative testing today but testing has been positive to avocado as well as beta lactoglobulin in the past (recommended continued avoidance of avocado, but slow introduction of milk)   Plan/Recommendations:    1. Chronic rhinitis - Testing today showed: grasses, ragweed, weeds, trees, indoor molds, outdoor molds, dust mites, cat, and dog as well as horses  - Copy of test results provided.  - Avoidance measures provided. - Stop taking: Zyrtec - Continue with: Flonase (fluticasone) two sprays per nostril daily (AIM FOR EAR ON EACH SIDE) - Start taking: Allegra (fexofenadine) 180mg  tablet once daily and Astelin (azelastine) 2 sprays per nostril 1-2 times daily as needed - You can use an extra dose of the antihistamine, if needed, for breakthrough symptoms.  - Consider nasal saline rinses 1-2 times daily to remove allergens from the nasal cavities as well as help with mucous clearance (this is especially helpful to do before the nasal sprays are given) - Consider allergy shots as a means of long-term control. - Allergy shots "re-train" and "reset" the immune system to ignore environmental allergens and decrease the resulting immune response to those allergens (sneezing, itchy watery eyes, runny nose, nasal congestion, etc).    - Allergy shots improve symptoms in 75-85% of patients.  - We can discuss more at the next appointment if the medications are not working for you.  2. Flexural atopic dermatitis - We are not going to change to a more mild anti inflammatory that is also safe to use on your face if you develop worsening rashes with introduction of milk products.  - Sample  provided.   3. 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 - Copy of testing results provided. - There is a the low positive predictive value of food allergy testing and hence the high possibility of false positives. - In contrast, food allergy testing has a high negative predictive value, therefore if testing is negative we can be relatively assured that they are indeed negative.  - I would introduce milk and prescription forms as well as anything that has milk on the label. - I would continue to avoid cows milk, cheese, and yogurt (ie major sources of dairy). - We are holding off on an EpiPen.  4. Follow up in 2-3 months or sooner if needed.      This note in its entirety was forwarded to the Provider who requested this consultation.  Subjective:   Kristy Ross is a 31 y.o. female presenting today for evaluation of  Chief Complaint  Patient presents with   Allergic Reaction    Dairy - rash around mouth, mucus, digestive issues , avocado - severe digestive cramping, brain fog, fatigue    Eczema    Right Hand - middle finger, rig finger, and the base of her pinky    Asthma    Good since she started taking flovent     Kristy Ross has a history of the following: Patient Active Problem List   Diagnosis Date Noted   Eczema 09/08/2019   Anxiety 06/04/2019   Cervical polyp 11/27/2017   MDD (major depressive disorder), recurrent episode ([place])  01/13/2017   BMI 40.0-44.9, adult ([place]) 12/04/2015   Mild intermittent asthma 12/04/2015   Seasonal allergies 12/04/2015   Essential hypertension 12/04/2015    History obtained from: chart review and patient.  Kristy Ross was referred by Kristy Macadam, MD.     Kristy Ross is a 31 y.o. female presenting for an evaluation of food and environmental allergies . She is from [place]. She moved here around 8 years ago this summer. 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  They do all of the costumes and brand names. They have an office here and she works from home.    Allergic Rhinitis Symptom History: She knows that she is allergic to the cat. She cannot get rid of him. She does  cetirizine throughout the year and Flonase during the allergy seasons.  She notes that her cat is one of her triggers.  She also notes she has issues during the springtime season.  The symptoms are more severe when she is visiting family in [place].  Food Allergy Symptom History: She has been told that she is allergic to dairy and avocado. She breakouts out in a rash and has digestive issues. Avocado results in severe stomach cramps and brain fog and fatigue.  This avocado was noted around age 1. She stopped dairy over one year ago and her symptoms improved. She has not had dairy in any form. She does well with reading labels. She drinks either almond or soy milk. She has avoided avocado completely since that. She noticed a reaction years later and stopped using it. She also thinks that she has a similar reaction to ginger. She had an IgE to avocado one year ago of 3.25. She had a positive lacto-globulin in 0.25. She is fine with all of the pother food allergens, from what I can tell, but she reports that she has a sensitive stomach. She has limited what she eats quantity wise.   Skin Symptom History: She has had eczema on her bilateral hands.  She is on betamethasone and she is not good about putting it. This is good day today.   Otherwise, there is no history of other atopic diseases, including asthma, drug allergies, stinging insect allergies, urticaria, or contact dermatitis. There is no significant infectious history. Vaccinations are up to date.    Past Medical History: Patient Active Problem List   Diagnosis Date Noted   Eczema 09/08/2019   Anxiety 06/04/2019   Cervical polyp 11/27/2017   MDD (major depressive disorder), recurrent episode ([place])  01/13/2017   BMI 40.0-44.9, adult ([place]) 12/04/2015   Mild intermittent asthma 12/04/2015   Seasonal allergies 12/04/2015   Essential hypertension 12/04/2015    Medication List:  Allergies as of 04/10/2021       Reactions   Bee Venom Anaphylaxis   Avocado Other (See Comments)   Stomach pain, flu like symptoms   Niacin And Related Hives   Milk-related Compounds Rash        Medication List        Accurate as of April 10, 2021 10:16 AM. If you have any questions, ask your nurse or doctor.          betamethasone dipropionate 0.05 % cream Apply topically 2 (two) times daily.   cetirizine 10 MG tablet Commonly known as: ZYRTEC Take 10 mg by mouth daily.   FLUoxetine 10 MG capsule Commonly known as: PROZAC TAKE 1 CAPSULE BY MOUTH EVERY DAY   fluticasone 44  MCG/ACT inhaler Commonly known as: Flovent HFA INHALE 1 PUFF BY MOUTH TWICE A DAY   fluticasone 50 MCG/ACT nasal spray Commonly known as: FLONASE Place into both nostrils as needed.   hydrochlorothiazide 25 MG tablet Commonly known as: HYDRODIURIL TAKE 1 TABLET (25 MG TOTAL) BY MOUTH DAILY.   irbesartan 75 MG tablet Commonly known as: AVAPRO Take 1 tablet (75 mg total) by mouth daily.   ProAir HFA 108 (90 Base) MCG/ACT inhaler Generic drug: albuterol INHALE 1 PUFF INTO THE LUNGS EVERY 6 (SIX) HOURS AS NEEDED FOR WHEEZING OR SHORTNESS OF BREATH.   VITAMIN D3 PO Take 2,000 Units by mouth daily.        Birth History: non-contributory  Developmental History: non-contributory  Past Surgical History: History reviewed. No pertinent surgical history.   Family History: Family History  Problem Relation Age of Onset   Hypertension Father    Heart disease Father    Breast cancer Mother 24   Hyperlipidemia Maternal Grandfather    CVA Maternal Grandfather 40   Hyperlipidemia Paternal Grandmother    Hyperlipidemia Paternal Grandfather    Stroke Paternal Grandfather 28   High blood pressure Brother     High Cholesterol Brother    Other Maternal Grandmother        brain tumor; not cancer     Social History: Jahnice lives at home with her family.  She lives in an apartment that is 32 years old.  There is carpeting throughout the home.  She has electric heating and central cooling.  There is a cat inside of the home and a cat and dog outside of the home.  There are dust mite covers on the bed, but not the pillows.  There is no tobacco exposure.  She currently works from home.  She is not exposed to fumes, chemicals, or dust.  She does not live near an interstate or industrial area.   Review of Systems  Constitutional: Negative.  Negative for chills, fever, malaise/fatigue and weight loss.  HENT:  Positive for congestion and sinus pain. Negative for ear discharge and ear pain.        Positive for postnasal drip.  Eyes:  Negative for pain, discharge and redness.  Respiratory:  Positive for cough. Negative for sputum production, shortness of breath and wheezing.   Cardiovascular: Negative.  Negative for chest pain and palpitations.  Gastrointestinal:  Negative for abdominal pain, constipation, diarrhea, heartburn, nausea and vomiting.  Skin: Negative.  Negative for itching and rash.  Neurological:  Negative for dizziness and headaches.  Endo/Heme/Allergies:  Positive for environmental allergies. Does not bruise/bleed easily.       Positive for food allergies.      Objective:   Blood pressure 124/82, pulse 83, temperature 98 F (36.7 C), resp. rate 18, height 5' 2.5" (1.588 m), weight 248 lb (112.5 kg), SpO2 97 %. Body mass index is 44.64 kg/m.   Physical Exam:   Physical Exam Vitals reviewed.  Constitutional:      Appearance: She is well-developed.  HENT:     Head: Normocephalic and atraumatic.     Right Ear: Tympanic membrane, ear canal and external ear normal. No drainage, swelling or tenderness. Tympanic membrane is not injected, scarred, erythematous, retracted or bulging.      Left Ear: Tympanic membrane, ear canal and external ear normal. No drainage, swelling or tenderness. Tympanic membrane is not injected, scarred, erythematous, retracted or bulging.     Nose: No nasal deformity, septal deviation, mucosal edema or  rhinorrhea.     Right Turbinates: Enlarged, swollen and pale.     Left Turbinates: Enlarged, swollen and pale.     Right Sinus: No maxillary sinus tenderness or frontal sinus tenderness.     Left Sinus: No maxillary sinus tenderness or frontal sinus tenderness.     Mouth/Throat:     Mouth: Mucous membranes are not pale and not dry.     Pharynx: Uvula midline.  Eyes:     General:        Right eye: No discharge.        Left eye: No discharge.     Conjunctiva/sclera: Conjunctivae normal.     Right eye: Right conjunctiva is not injected. No chemosis.    Left eye: Left conjunctiva is not injected. No chemosis.    Pupils: Pupils are equal, round, and reactive to light.  Cardiovascular:     Rate and Rhythm: Normal rate and regular rhythm.     Heart sounds: Normal heart sounds.  Pulmonary:     Effort: Pulmonary effort is normal. No tachypnea, accessory muscle usage or respiratory distress.     Breath sounds: Normal breath sounds. No wheezing, rhonchi or rales.     Comments: Moving air well in all lung fields.  No increased work of breathing. Chest:     Chest wall: No tenderness.  Abdominal:     Tenderness: There is no abdominal tenderness. There is no guarding or rebound.  Lymphadenopathy:     Head:     Right side of head: No submandibular, tonsillar or occipital adenopathy.     Left side of head: No submandibular, tonsillar or occipital adenopathy.     Cervical: No cervical adenopathy.  Skin:    General: Skin is warm.     Capillary Refill: Capillary refill takes less than 2 seconds.     Coloration: Skin is not pale.     Findings: Rash present. No abrasion, erythema or petechiae. Rash is not papular, urticarial or vesicular.     Comments:  She has eczema on her bilateral hands.  Neurological:     Mental Status: She is alert.  Psychiatric:        Behavior: Behavior is cooperative.     Diagnostic studies:     Allergy Studies:     Airborne Adult Perc - 04/10/21 0927     Time Antigen Placed [id number]    Allergen Manufacturer Lavella Hammock    Location Back    Number of Test 59    1. Control-Buffer 50% Glycerol Negative    2. Control-Histamine 1 mg/ml 2+    3. Albumin saline Negative    4. [place] Negative    5. [place] Negative    6. Johnson Negative    7. [place] Blue Negative    8. Meadow Fescue Negative    9. Perennial Rye Negative    10. Sweet Vernal Negative    11. Timothy Negative    12. Cocklebur Negative    13. Burweed Marshelder Negative    14. Ragweed, short Negative    15. Ragweed, Giant Negative    16. Plantain,  English Negative    17. Lamb's Quarters Negative    18. Sheep Sorrell Negative    19. Rough Pigweed Negative    20. Marsh Elder, Rough Negative    21. Mugwort, Common Negative    22. Ash mix Negative    23. Birch mix Negative    24. Beech American Negative    25. Box, Elder Negative  [place], red Negative    27. Cottonwood, [place] Negative    28. Elm mix Negative    29. Hickory Negative    30. Maple mix Negative    31. Oak, [place] mix Negative    32. Pecan Pollen Negative    33. Pine mix Negative    34. Sycamore Eastern Negative    35. [place], Black Pollen Negative    36. Alternaria alternata Negative    37. Cladosporium Herbarum Negative    38. Aspergillus mix Negative    39. Penicillium mix Negative    40. Bipolaris sorokiniana (Helminthosporium) Negative    41. Drechslera spicifera (Curvularia) Negative    42. Mucor plumbeus Negative    43. Fusarium moniliforme Negative    44. Aureobasidium pullulans (pullulara) Negative    45. Rhizopus oryzae Negative    46. Botrytis cinera Negative    47. Epicoccum nigrum Negative    48. Phoma betae Negative    49. Candida Albicans  Negative    50. Trichophyton mentagrophytes Negative    51. Mite, D Farinae  5,000 AU/ml Negative    52. Mite, D Pteronyssinus  5,000 AU/ml Negative    53. Cat Hair 10,000 BAU/ml Negative    54.  Dog Epithelia Negative    55. Mixed Feathers Negative    56. Horse Epithelia 2+    57. Cockroach, German Negative    58. Mouse Negative    59. Tobacco Leaf Negative             Intradermal - 04/10/21 1012     Time Antigen Placed 1000    Allergen Manufacturer Gre[place]    Location Arm    Number of Test 15    Control Negative    Guatem[place] Negative    Johnson Negative    7 Grass 4+    Ragweed mix 2+    Weed mix Negative    Tree mix 3+    Mold 1 2+    Mold 2 3+    Mold 3 Negative    Mold 4 Negative    Cat 3+    Dog 3+    Cockroach Negative    Mite mix 4+             Food Adult Perc - 04/10/21 0900     Time Antigen Placed [id number]    Allergen Manufacturer Lavella Hammock    Location Back    Number of allergen test 22    1. Peanut Negative    2. Soybean Negative    3. Wheat Negative    4. Sesame Negative    5. Milk, cow Negative    6. Egg White, Chicken Negative    7. Casein Negative    8. Shellfish Mix Negative    9. Fish Mix Negative    10. Cashew Negative    11. Pecan Food Negative    12. [place] Negative    13. Almond Negative    14. Hazelnut Negative    15. [place] nut Negative    16. Coconut Negative    17. Pistachio Negative    42. Tomato Negative    58. Apple Negative    63. Pineapple Negative    69. Ginger Negative    70. Garlic Negative             Allergy testing results were read and interpreted by myself, documented by clinical staff.         Salvatore Marvel, MD Allergy and Asthma  [facility]

## 2021-06-22 ENCOUNTER — Other Ambulatory Visit: Payer: Self-pay | Admitting: Family Medicine

## 2021-07-10 ENCOUNTER — Ambulatory Visit: Payer: 59 | Admitting: Allergy & Immunology

## 2021-07-10 ENCOUNTER — Encounter: Payer: Self-pay | Admitting: Allergy & Immunology

## 2021-07-10 VITALS — BP 110/80 | HR 70 | Temp 98.4°F | Resp 12 | Ht 63.0 in | Wt 253.6 lb

## 2021-07-10 DIAGNOSIS — L2089 Other atopic dermatitis: Secondary | ICD-10-CM

## 2021-07-10 DIAGNOSIS — J3089 Other allergic rhinitis: Secondary | ICD-10-CM

## 2021-07-10 DIAGNOSIS — T7800XD Anaphylactic reaction due to unspecified food, subsequent encounter: Secondary | ICD-10-CM | POA: Diagnosis not present

## 2021-07-10 DIAGNOSIS — J302 Other seasonal allergic rhinitis: Secondary | ICD-10-CM

## 2021-07-10 MED ORDER — AZELASTINE HCL 0.1 % NA SOLN: 1.0000 | Freq: Two times a day (BID) | NASAL | 5 refills | Status: AC | PRN

## 2021-07-10 NOTE — Patient Instructions (Addendum)
1. Chronic rhinitis ?- Previous testing showed: grasses, ragweed, weeds, trees, indoor molds, outdoor molds, dust mites, cat, and dog as well as horses  ?- Continue with: Flonase (fluticasone) two sprays per nostril daily (AIM FOR EAR ON EACH SIDE) ?- Continue with: Allegra (fexofenadine) 180mg  tablet once daily and Astelin (azelastine) 2 sprays per nostril 1-2 times daily as needed ?- I sent in Astelin to have on hand to use as needed.  ?- You can use an extra dose of the antihistamine, if needed, for breakthrough symptoms.  ?- Consider nasal saline rinses 1-2 times daily to remove allergens from the nasal cavities as well as help with mucous clearance (this is especially helpful to do before the nasal sprays are given) ?- We can hold off on allergy shots for now.  ? ?2. Flexural atopic dermatitis ?- We will continue with Eucrisa as needed (another sample provided). ?- Continue with moisturizing as you are doing.  ? ?3. 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 ?- I am glad that you have been able to introduce the cow's milk into your diet!  ?- We are holding off on an EpiPen. ? ?4. Return in about 1 year (around 07/11/2022).  ? ? ?Please inform 09/10/2022 of any Emergency Department visits, hospitalizations, or changes in symptoms. Call us before going to the ED for breathing or allergy symptoms since we might be able to fit you in for a sick visit. Feel free to contact us anytime with any questions, problems, or concerns. ? ?It was a pleasure to see you again today! ? ?Websites that have reliable patient information: ?1. American Academy of Asthma, Allergy, and Immunology: www.aaaai.org ?2. Food Allergy Research and Education (FARE): foodallergy.org ?3. Mothers of Asthmatics: http://www.asthmacommunitynetwork.org ?4. [place] of Allergy, Asthma, and Immunology: [employer] ? ? ?COVID-19 Vaccine Information can be found at:  [email or web address].ca For questions related to vaccine distribution or appointments, please email [email or web address] or call [phone].  ? ?We realize that you might be concerned about having an allergic reaction to the COVID19 vaccines. To help with that concern, WE ARE OFFERING THE COVID19 VACCINES IN OUR OFFICE! Ask the front desk for dates!  ? ? ? ??Like? [phone] on Facebook and Instagram for our latest updates!  ?  ? ? ?A healthy democracy works best when [place] participate! Make sure you are registered to vote! If you have moved or changed any of your contact information, you will need to get this updated before voting! ? ?In some cases, you MAY be able to register to vote online: [employer] ? ? ? ? ? ? ? ? ? ?

## 2021-07-10 NOTE — Progress Notes (Signed)
? ?FOLLOW UP ? ?Date of Service/Encounter:  07/10/21 ? ? ?Assessment:  ?  ?Perennial and seasonal allergic rhinitis (grasses, ragweed, weeds, trees, indoor molds, outdoor molds, dust mites, cat, and dog as well as horses) ?  ?Hand eczema - controlled with Eucrisa ?  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  ? ?Plan/Recommendations:  ? ?1. Chronic rhinitis ?- Previous testing showed: grasses, ragweed, weeds, trees, indoor molds, outdoor molds, dust mites, cat, and dog as well as horses  ?- Continue with: Flonase (fluticasone) two sprays per nostril daily (AIM FOR EAR ON EACH SIDE) ?- Continue with: Allegra (fexofenadine) 180mg  tablet once daily and Astelin (azelastine) 2 sprays per nostril 1-2 times daily as needed ?- I sent in Astelin to have on hand to use as needed.  ?- You can use an extra dose of the antihistamine, if needed, for breakthrough symptoms.  ?- Consider nasal saline rinses 1-2 times daily to remove allergens from the nasal cavities as well as help with mucous clearance (this is especially helpful to do before the nasal sprays are given) ?- We can hold off on allergy shots for now.  ? ?2. Flexural atopic dermatitis ?- We will continue with Eucrisa as needed (another sample provided). ?- Continue with moisturizing as you are doing.  ? ?3. 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 ?- I am glad that you have been able to introduce the cow's milk into your diet!  ?- We are holding off on an EpiPen. ? ?4. Return in about 1 year (around 07/11/2022).  ? ? ? ?Subjective:  ? ?Kristy Ross is a 31 y.o. female presenting today for follow up of  ?Chief Complaint  ?Patient presents with  ? Follow-up  ?  Everything is good right now.  ? ? ?Kristy Ross has a history of the following: ?Patient Active Problem List  ? Diagnosis Date Noted  ? Eczema 09/08/2019  ? Anxiety 06/04/2019  ? Cervical polyp 11/27/2017  ? MDD (major depressive  disorder), recurrent episode (HCC) 01/13/2017  ? BMI 40.0-44.9, adult (HCC) 12/04/2015  ? Mild intermittent asthma 12/04/2015  ? Seasonal allergies 12/04/2015  ? Essential hypertension 12/04/2015  ? ? ?History obtained from: chart review and patient. ? ?[name] is a 31 y.o. female presenting for a follow up visit.  She was last seen in January 2020.  At that time, she had testing that was positive to multiple indoor and outdoor allergens.  We stopped her Zyrtec and continue with Flonase.  We started Allegra and Astelin as well.  For her atopic dermatitis, she was doing well with over-the-counter hydrocortisone.  For her food allergies, she had testing that was negative to everything that we tested.  Recommended avoidance of cows milk, cheese, and yogurt.  However, we recommended introducing milk and prescription that might contain milk into her diet to see how she did. ? ?Since the last visit, she has done well. ? ?Allergic Rhinitis Symptom History: February 2020 is working. She is using the Flonase.  She developed asthma when she went.  She is open to having something else on board if things get worse, but the Allegra and Flonase seem to be doing the trick right now.  She has not needed any systemic steroids. ? ?Food Allergy Symptom History: She did introduce the milk into her diet. She had different tyupes of food at [place] and she had one issues but she thinks that it was  a cummulative amount. She tolerates organic dairy fairly well. She does tolerate lactose free milk.  She continues to avoid avocados.  She is just not interested in introducing those at all. ? ?Otherwise, there have been no changes to her past medical history, surgical history, family history, or social history. ? ? ? ?Review of Systems  ?Constitutional: Negative.  Negative for chills, fever, malaise/fatigue and weight loss.  ?HENT: Negative.  Negative for congestion, ear discharge and ear pain.   ?Eyes:  Negative for pain, discharge and redness.   ?Respiratory:  Negative for cough, sputum production, shortness of breath and wheezing.   ?Cardiovascular: Negative.  Negative for chest pain and palpitations.  ?Gastrointestinal:  Negative for abdominal pain, diarrhea, heartburn, nausea and vomiting.  ?Skin: Negative.  Negative for itching and rash.  ?Neurological:  Negative for dizziness and headaches.  ?Endo/Heme/Allergies:  Positive for environmental allergies. Does not bruise/bleed easily.   ? ? ? ?Objective:  ? ?Blood pressure 110/80, pulse 70, temperature 98.4 ?F (36.9 ?C), temperature source Temporal, resp. rate 12, height 5\' 3"  (1.6 m), weight 253 lb 9.6 oz (115 kg), SpO2 98 %. ?Body mass index is 44.92 kg/m?. ? ? ? ?Physical Exam ?Vitals reviewed.  ?Constitutional:   ?   Appearance: She is well-developed.  ?   Comments: Very bubbly and delightful.  ?HENT:  ?   Head: Normocephalic and atraumatic.  ?   Right Ear: Tympanic membrane, ear canal and external ear normal. No drainage, swelling or tenderness. Tympanic membrane is not injected, scarred, erythematous, retracted or bulging.  ?   Left Ear: Tympanic membrane, ear canal and external ear normal. No drainage, swelling or tenderness. Tympanic membrane is not injected, scarred, erythematous, retracted or bulging.  ?   Nose: No nasal deformity, septal deviation, mucosal edema or rhinorrhea.  ?   Right Turbinates: Enlarged, swollen and pale.  ?   Left Turbinates: Enlarged, swollen and pale.  ?   Right Sinus: No maxillary sinus tenderness or frontal sinus tenderness.  ?   Left Sinus: No maxillary sinus tenderness or frontal sinus tenderness.  ?   Comments: No nasal polyps. ?   Mouth/Throat:  ?   Mouth: Mucous membranes are not pale and not dry.  ?   Pharynx: Uvula midline.  ?Eyes:  ?   General:     ?   Right eye: No discharge.     ?   Left eye: No discharge.  ?   Conjunctiva/sclera: Conjunctivae normal.  ?   Right eye: Right conjunctiva is not injected. No chemosis. ?   Left eye: Left conjunctiva is not  injected. No chemosis. ?   Pupils: Pupils are equal, round, and reactive to light.  ?Cardiovascular:  ?   Rate and Rhythm: Normal rate and regular rhythm.  ?   Heart sounds: Normal heart sounds.  ?Pulmonary:  ?   Effort: Pulmonary effort is normal. No tachypnea, accessory muscle usage or respiratory distress.  ?   Breath sounds: Normal breath sounds. No wheezing, rhonchi or rales.  ?   Comments: Moving air well in all lung fields.  No increased work of breathing. ?Chest:  ?   Chest wall: No tenderness.  ?Lymphadenopathy:  ?   Head:  ?   Right side of head: No submandibular, tonsillar or occipital adenopathy.  ?   Left side of head: No submandibular, tonsillar or occipital adenopathy.  ?   Cervical: No cervical adenopathy.  ?Skin: ?   General: Skin is warm.  ?  Capillary Refill: Capillary refill takes less than 2 seconds.  ?   Coloration: Skin is not pale.  ?   Findings: Rash present. No abrasion, erythema or petechiae. Rash is not papular, urticarial or vesicular.  ?   Comments: She has eczema on her bilateral hands.  ?Neurological:  ?   Mental Status: She is alert.  ?Psychiatric:     ?   Behavior: Behavior is cooperative.  ?  ? ?Diagnostic studies: none ? ? ?  ?Malachi BondsJoel Kaelin Holford, MD  ?[facility] ? ? ? ? ? ? ?

## 2021-07-30 ENCOUNTER — Other Ambulatory Visit: Payer: Self-pay | Admitting: Family Medicine

## 2021-08-13 ENCOUNTER — Ambulatory Visit
Admission: RE | Admit: 2021-08-13 | Discharge: 2021-08-13 | Disposition: A | Discharge status: Home / Self Care | Payer: 59 | Source: Ambulatory Visit | Attending: Family Medicine | Admitting: Family Medicine

## 2021-08-13 ENCOUNTER — Other Ambulatory Visit: Payer: Self-pay | Admitting: Family Medicine

## 2021-08-13 DIAGNOSIS — R590 Localized enlarged lymph nodes: Secondary | ICD-10-CM

## 2021-11-27 ENCOUNTER — Other Ambulatory Visit: Payer: Self-pay | Admitting: *Deleted

## 2021-11-27 MED ORDER — FLUTICASONE PROPIONATE HFA 44 MCG/ACT IN AERO: INHALATION_SPRAY | RESPIRATORY_TRACT | 1 refills | Status: DC

## 2021-11-27 NOTE — Telephone Encounter (Signed)
Ok to refill 

## 2021-12-12 ENCOUNTER — Encounter: Payer: Self-pay | Admitting: Family Medicine

## 2021-12-12 ENCOUNTER — Ambulatory Visit: Payer: 59 | Admitting: Family Medicine

## 2021-12-12 VITALS — BP 100/76 | HR 100 | Temp 98.4°F | Ht 63.0 in | Wt 251.6 lb

## 2021-12-12 DIAGNOSIS — N926 Irregular menstruation, unspecified: Secondary | ICD-10-CM | POA: Diagnosis not present

## 2021-12-12 DIAGNOSIS — Z23 Encounter for immunization: Secondary | ICD-10-CM

## 2021-12-12 DIAGNOSIS — F330 Major depressive disorder, recurrent, mild: Secondary | ICD-10-CM | POA: Diagnosis not present

## 2021-12-12 DIAGNOSIS — Z6841 Body Mass Index (BMI) 40.0 and over, adult: Secondary | ICD-10-CM

## 2021-12-12 DIAGNOSIS — I10 Essential (primary) hypertension: Secondary | ICD-10-CM | POA: Diagnosis not present

## 2021-12-12 MED ORDER — IRBESARTAN 75 MG PO TABS: 75.0000 mg | ORAL_TABLET | Freq: Every day | ORAL | 5 refills | Status: DC

## 2021-12-12 MED ORDER — HYDROCHLOROTHIAZIDE 25 MG PO TABS: 25.0000 mg | ORAL_TABLET | Freq: Every day | ORAL | 5 refills | Status: DC

## 2021-12-12 MED ORDER — FLUOXETINE HCL 10 MG PO CAPS: 10.0000 mg | ORAL_CAPSULE | Freq: Every day | ORAL | 5 refills | Status: DC

## 2021-12-12 NOTE — Assessment & Plan Note (Signed)
Current hypertension medications:      Sig   hydrochlorothiazide (HYDRODIURIL) 25 MG tablet Take 1 tablet (25 mg total) by mouth daily.   irbesartan (AVAPRO) 75 MG tablet Take 1 tablet (75 mg total) by mouth daily.     BP is well controlled on the above medications, will continue these as prescribed.

## 2021-12-12 NOTE — Assessment & Plan Note (Signed)
With anxiety, good control on the 10 mg prozac daily, will continue this.

## 2021-12-12 NOTE — Assessment & Plan Note (Signed)
Patient is currently using weight watchers for her dietary program. Patient also goes to therapy twice a week and has undergone behavioral therapy for her obesity as well. We had a long discussion about medications that could help her achieve her goal weight, she wants to wait for a few months to try diet and exercise first, then if she is having difficulty then she will follow up to discuss medications further.

## 2021-12-12 NOTE — Progress Notes (Signed)
Established Patient Office Visit  Subjective   Patient ID: Kristy Ross, female    DOB: 1990-11-09  Age: 31 y.o. MRN: [id number]  Chief Complaint  Patient presents with   Establish Care    Patient is here for transition of care visit. She reports that she has noticed that she is going longer in between periods, states that every month it's a few days later. Reports that she continues to get periods, no severe cramping, no pelvic pain. States she has had more stress in her life currently. She reports that about 3 weeks ago she joined [employer], is down over 7 pounds already, is actively making changes to her diet to lose weight. States that she is working towards losing weight.   Anxiety/depression-- pt on 10 mg prozac daily, she rpeorts good control of her symptoms with this medication. Has been on the medication for about 2 years, we discussed potentially stopping however it is not a requirement, pt reports she wishes to continue the medication.  HTN -- BP in office performed and is well controlled. She  reports no side effects to the medications, no chest pain, SOB, dizziness or headaches. She has a BP cuff at home and is checking BP regularly, reports they are in the normal range.       Current Outpatient Medications  Medication Instructions   albuterol (VENTOLIN HFA) 108 (90 Base) MCG/ACT inhaler 2 puffs, Inhalation, Every 6 hours PRN   azelastine (ASTELIN) 0.1 % nasal spray 1 spray, Each Nare, 2 times daily PRN, Use in each nostril as directed   betamethasone dipropionate 0.05 % cream Topical, 2 times daily   Cholecalciferol (VITAMIN D3 PO) 2,000 Units, Oral, Daily   Ferrous Sulfate (IRON PO) Oral, Daily   fexofenadine (ALLEGRA ALLERGY) 180 mg, Oral, Daily   FLUoxetine (PROZAC) 10 mg, Oral, Daily   fluticasone (FLONASE) 50 MCG/ACT nasal spray 2 sprays, Each Nare, Daily   fluticasone (FLOVENT HFA) 44 MCG/ACT inhaler INHALE 1 PUFF BY MOUTH TWICE A DAY   hydrochlorothiazide  (HYDRODIURIL) 25 mg, Oral, Daily   irbesartan (AVAPRO) 75 mg, Oral, Daily   UNABLE TO FIND Oral, Daily, Spearment    Patient Active Problem List   Diagnosis Date Noted   Eczema 09/08/2019   Anxiety 06/04/2019   Cervical polyp 11/27/2017   MDD (major depressive disorder), recurrent episode ([place]) 01/13/2017   BMI 40.0-44.9, adult ([place]) 12/04/2015   Mild intermittent asthma 12/04/2015   Seasonal allergies 12/04/2015   Essential hypertension 12/04/2015      Review of Systems  All other systems reviewed and are negative.     Objective:     BP 100/76 (BP Location: Left Arm, Patient Position: Sitting, Cuff Size: Large)   Pulse 100   Temp 98.4 F (36.9 C) (Oral)   Ht 5\' 3"  (1.6 m)   Wt 251 lb 9.6 oz (114.1 kg)   LMP 11/29/2021 (Exact Date)   SpO2 99%   BMI 44.57 kg/m  BP Readings from Last 3 Encounters:  12/12/21 100/76  07/10/21 110/80  04/10/21 124/82   Wt Readings from Last 3 Encounters:  12/12/21 251 lb 9.6 oz (114.1 kg)  07/10/21 253 lb 9.6 oz (115 kg)  04/10/21 248 lb (112.5 kg)      Physical Exam Vitals reviewed.  Constitutional:      Appearance: Normal appearance. She is well-groomed. She is obese.  HENT:     Head: Normocephalic and atraumatic.  Eyes:     Conjunctiva/sclera: Conjunctivae normal.  Neck:     Thyroid: No thyromegaly.  Cardiovascular:     Rate and Rhythm: Normal rate and regular rhythm.     Pulses: Normal pulses.     Heart sounds: S1 normal and S2 normal.  Pulmonary:     Effort: Pulmonary effort is normal.     Breath sounds: Normal breath sounds and air entry.  Abdominal:     General: Bowel sounds are normal.  Musculoskeletal:        General: Normal range of motion.     Cervical back: Normal range of motion and neck supple.     Right lower leg: No edema.     Left lower leg: No edema.  Skin:    General: Skin is warm and dry.     Comments: No abnormal hair growth or hirsutism  Neurological:     Mental Status: She is alert and  oriented to person, place, and time. Mental status is at baseline.     Gait: Gait is intact.  Psychiatric:        Mood and Affect: Mood and affect normal.        Speech: Speech normal.        Behavior: Behavior normal.        Judgment: Judgment normal.      No results found for any visits on 12/12/21.    The ASCVD Risk score (Arnett DK, et al., 2019) failed to calculate for the following reasons:   The 2019 ASCVD risk score is only valid for ages 42 to 47    Assessment & Plan:   Problem List Items Addressed This Visit       Cardiovascular and Mediastinum   Essential hypertension    Current hypertension medications:       Sig   hydrochlorothiazide (HYDRODIURIL) 25 MG tablet Take 1 tablet (25 mg total) by mouth daily.   irbesartan (AVAPRO) 75 MG tablet Take 1 tablet (75 mg total) by mouth daily.  BP is well controlled on the above medications, will continue these as prescribed.      Relevant Medications   irbesartan (AVAPRO) 75 MG tablet   hydrochlorothiazide (HYDRODIURIL) 25 MG tablet     Other   BMI 40.0-44.9, adult (HCC)    Patient is currently using weight watchers for her dietary program. Patient also goes to therapy twice a week and has undergone behavioral therapy for her obesity as well. We had a long discussion about medications that could help her achieve her goal weight, she wants to wait for a few months to try diet and exercise first, then if she is having difficulty then she will follow up to discuss medications further.      MDD (major depressive disorder), recurrent episode (HCC)    With anxiety, good control on the 10 mg prozac daily, will continue this.       Relevant Medications   FLUoxetine (PROZAC) 10 MG capsule   Other Visit Diagnoses     Need for immunization against influenza    -  Primary   Relevant Orders   Flu Vaccine QUAD 6+ mos PF IM (Fluarix Quad PF) (Completed)   Irregular periods     I have ordered testosterone, fasting insulin  level, A1C to rule out PCOS. Periods are still present but they are changing, cycles are getting longer.    Morbid obesity (HCC)           Return in about 6 months (around 06/13/2022) for Annual physical exam.  Farrel Conners, MD

## 2021-12-19 ENCOUNTER — Other Ambulatory Visit: Payer: 59

## 2022-01-01 ENCOUNTER — Telehealth: Payer: Self-pay | Admitting: *Deleted

## 2022-01-01 DIAGNOSIS — N926 Irregular menstruation, unspecified: Secondary | ICD-10-CM

## 2022-01-01 NOTE — Telephone Encounter (Signed)
Patient was previously scheduled for an appt on 10/27 at 7:40am.  Teams message sent to [facility].

## 2022-01-01 NOTE — Telephone Encounter (Signed)
Message sent to PCP as I do not see any future orders from the last visit.

## 2022-01-01 NOTE — Telephone Encounter (Signed)
Hmm I think I was supposed to order a fasting insulin, A1C and morning testosterone level-- are they in the computer?

## 2022-01-01 NOTE — Telephone Encounter (Signed)
Doren Custard, lab tech sent a teams message stating the patient has a lab appt on 10/27 and there are no orders entered.  Message sent to PCP for orders.

## 2022-01-01 NOTE — Telephone Encounter (Signed)
Spoke with the patient and she is aware to arrive at 7:30am on 10/27.

## 2022-01-04 ENCOUNTER — Other Ambulatory Visit (INDEPENDENT_AMBULATORY_CARE_PROVIDER_SITE_OTHER): Payer: 59

## 2022-01-04 DIAGNOSIS — N926 Irregular menstruation, unspecified: Secondary | ICD-10-CM | POA: Diagnosis not present

## 2022-01-04 LAB — TESTOSTERONE: Testosterone: 37.16 ng/dL (ref 15.00–40.00)

## 2022-01-04 LAB — HEMOGLOBIN A1C: Hgb A1c MFr Bld: 5.5 % (ref 4.6–6.5)

## 2022-01-07 LAB — INSULIN, RANDOM: Insulin: 21.5 u[IU]/mL — ABNORMAL HIGH

## 2022-01-08 NOTE — Progress Notes (Signed)
A1C is normal, however her insulin level it elevated-- I recommend reducing sugar and starches in her diet and increasing protein -- testosterone level is normal

## 2022-01-15 ENCOUNTER — Other Ambulatory Visit: Payer: Self-pay

## 2022-01-15 ENCOUNTER — Other Ambulatory Visit: Payer: Self-pay | Admitting: Family Medicine

## 2022-01-15 MED ORDER — ALBUTEROL SULFATE HFA 108 (90 BASE) MCG/ACT IN AERS
2.0000 | INHALATION_SPRAY | Freq: Four times a day (QID) | RESPIRATORY_TRACT | 2 refills | Status: DC | PRN
  Filled 2022-01-15: qty 6.7, 25d supply, fill #0

## 2022-01-22 ENCOUNTER — Telehealth: Payer: Self-pay | Admitting: Family Medicine

## 2022-01-22 MED ORDER — ALBUTEROL SULFATE HFA 108 (90 BASE) MCG/ACT IN AERS: 2.0000 | INHALATION_SPRAY | Freq: Four times a day (QID) | RESPIRATORY_TRACT | 2 refills | Status: AC | PRN

## 2022-01-22 NOTE — Telephone Encounter (Signed)
Rx done. 

## 2022-01-22 NOTE — Telephone Encounter (Signed)
Pt requests that script for albuterol (VENTOLIN HFA) 108 (90 Base) MCG/ACT inhaler  be sent to  [id number] [place], [place] - [address] Phone: [phone]  Fax: [phone]

## 2022-01-23 ENCOUNTER — Other Ambulatory Visit: Payer: Self-pay | Admitting: Allergy & Immunology

## 2022-01-24 ENCOUNTER — Other Ambulatory Visit: Payer: Self-pay

## 2022-02-18 ENCOUNTER — Encounter: Payer: Self-pay | Admitting: Family Medicine

## 2022-02-18 ENCOUNTER — Telehealth (INDEPENDENT_AMBULATORY_CARE_PROVIDER_SITE_OTHER): Payer: 59 | Admitting: Family Medicine

## 2022-02-18 VITALS — Ht 63.0 in

## 2022-02-18 DIAGNOSIS — R0981 Nasal congestion: Secondary | ICD-10-CM

## 2022-02-18 DIAGNOSIS — J4520 Mild intermittent asthma, uncomplicated: Secondary | ICD-10-CM

## 2022-02-18 DIAGNOSIS — J302 Other seasonal allergic rhinitis: Secondary | ICD-10-CM | POA: Diagnosis not present

## 2022-02-18 MED ORDER — PREDNISONE 20 MG PO TABS: 40.0000 mg | ORAL_TABLET | Freq: Every day | ORAL | 0 refills | Status: AC

## 2022-02-18 MED ORDER — FLUTICASONE PROPIONATE 50 MCG/ACT NA SUSP: 2.0000 | Freq: Every day | NASAL | 0 refills | Status: AC

## 2022-02-18 MED ORDER — AMOXICILLIN-POT CLAVULANATE 875-125 MG PO TABS: 1.0000 | ORAL_TABLET | Freq: Two times a day (BID) | ORAL | 0 refills | Status: AC

## 2022-02-18 NOTE — Progress Notes (Signed)
Virtual Visit via Video Note I connected with Kristy Ross on 02/18/22 by a video enabled telemedicine application and verified that I am speaking with the correct person using two identifiers. Location patient: home Location provider:work office Persons participating in the virtual visit: patient, provider  I discussed the limitations of evaluation and management by telemedicine and the availability of in person appointments. The patient expressed understanding and agreed to proceed.  Chief Complaint  Patient presents with   sinus pressure   HPI: Kristy Ross is a 31 yo female with PMHx significant for HTN,asthma, MDD/anxiety, IBS,and seasonal allergies c/o sinus congestion and postnasal drip for the past week, with worsening symptoms since yesterday.  She experiences facial pain, particularly in the sinus area, nasal congestion, rhinorrhea,and a sore throat. She also has a mild non productive cough, negative for SOB, CP,or wheezing. She denies any anosmia,ageusia,visual changes,abdominal pain,nausea, vomiting, fever, or chills.  Asthma on Albuterol inh and Flovent inh. Allergic rhinitis currently taking Allegra 180 mg daily and Astelin nasal spray. She has not used Flonase nasal spray, ran out. She works from home and has not been in contact with anyone who is sick.  She has not taken a COVID 19 test.  She has bee using Mucinex.  ROS: See pertinent positives and negatives per HPI.  Past Medical History:  Diagnosis Date   Allergy    Asthma    Blood in stool    BMI 40.0-44.9, adult ([place]) 12/04/2015   Depression    History of chicken pox    Hypertension    IBS (irritable bowel syndrome)    History reviewed. No pertinent surgical history.  Family History  Problem Relation Age of Onset   Hypertension Father    Heart disease Father    Breast cancer Mother 52   Hyperlipidemia Maternal Grandfather    CVA Maternal Grandfather 87   Hyperlipidemia Paternal Grandmother     Hyperlipidemia Paternal Grandfather    Stroke Paternal Grandfather 55   High blood pressure Brother    High Cholesterol Brother    Other Maternal Grandmother        brain tumor; not cancer    Social History   Socioeconomic History   Marital status: Single    Spouse name: Not on file   Number of children: Not on file   Years of education: Not on file   Highest education level: Bachelor's degree (e.g., BA, AB, BS)  Occupational History   Not on file  Tobacco Use   Smoking status: Never   Smokeless tobacco: Never  Substance and Sexual Activity   Alcohol use: No    Alcohol/week: 0.0 standard drinks of alcohol    Comment: rare   Drug use: No   Sexual activity: Not on file  Other Topics Concern   Not on file  Social History Narrative   Work or School: Veterinary surgeon for [employer] Situation: lives alone      Spiritual Beliefs: Christian      Lifestyle: no regular exercise - but starting to work on this and diet      Social Determinants of Health   Financial Resource Strain: Low Risk  (02/13/2021)   Overall Financial Resource Strain (CARDIA)    Difficulty of Paying Living Expenses: Not very hard  Food Insecurity: No Food Insecurity (02/13/2021)   Hunger Vital Sign    Worried About Running Out of Food in the Last Year: Never true    Ran Out of Food  in the Last Year: Never true  Transportation Needs: No Transportation Needs (02/13/2021)   PRAPARE - Administrator, Civil Service (Medical): No    Lack of Transportation (Non-Medical): No  Physical Activity: Sufficiently Active (02/13/2021)   Exercise Vital Sign    Days of Exercise per Week: 3 days    Minutes of Exercise per Session: 50 min  Stress: No Stress Concern Present (02/13/2021)   [employer] of Occupational Health - Occupational Stress Questionnaire    Feeling of Stress : Not at all  Social Connections: Moderately Integrated (02/13/2021)   Social Connection and Isolation Panel [NHANES]     Frequency of Communication with Friends and Family: More than three times a week    Frequency of Social Gatherings with Friends and Family: Three times a week    Attends Religious Services: 1 to 4 times per year    Active Member of Clubs or Organizations: Yes    Attends Engineer, structural: More than 4 times per year    Marital Status: Never married  Intimate Partner Violence: Not on file     Current Outpatient Medications:    albuterol (VENTOLIN HFA) 108 (90 Base) MCG/ACT inhaler, Inhale 2 puffs into the lungs every 6 (six) hours as needed for wheezing or shortness of breath., Disp: 6.7 g, Rfl: 2   amoxicillin-clavulanate (AUGMENTIN) 875-125 MG tablet, Take 1 tablet by mouth 2 (two) times daily for 7 days., Disp: 14 tablet, Rfl: 0   azelastine (ASTELIN) 0.1 % nasal spray, Place 1 spray into both nostrils 2 (two) times daily as needed for rhinitis. Use in each nostril as directed, Disp: 30 mL, Rfl: 5   betamethasone dipropionate 0.05 % cream, Apply topically 2 (two) times daily., Disp: 30 g, Rfl: 1   Cholecalciferol (VITAMIN D3 PO), Take 2,000 Units by mouth daily., Disp: , Rfl:    Ferrous Sulfate (IRON PO), Take by mouth daily., Disp: , Rfl:    fexofenadine (ALLEGRA) 180 MG tablet, TAKE 1 TABLET BY MOUTH EVERY DAY, Disp: 30 tablet, Rfl: 5   FLUoxetine (PROZAC) 10 MG capsule, Take 1 capsule (10 mg total) by mouth daily., Disp: 30 capsule, Rfl: 5   fluticasone (FLOVENT HFA) 44 MCG/ACT inhaler, INHALE 1 PUFF BY MOUTH TWICE A DAY, Disp: 10.6 each, Rfl: 1   hydrochlorothiazide (HYDRODIURIL) 25 MG tablet, Take 1 tablet (25 mg total) by mouth daily., Disp: 30 tablet, Rfl: 5   irbesartan (AVAPRO) 75 MG tablet, Take 1 tablet (75 mg total) by mouth daily., Disp: 30 tablet, Rfl: 5   predniSONE (DELTASONE) 20 MG tablet, Take 2 tablets (40 mg total) by mouth daily with breakfast for 3 days., Disp: 6 tablet, Rfl: 0   UNABLE TO FIND, Take by mouth daily. Spearment, Disp: , Rfl:    fluticasone  (FLONASE) 50 MCG/ACT nasal spray, Place 2 sprays into both nostrils daily., Disp: 16 g, Rfl: 0  EXAM:  VITALS per patient if applicable:Ht 5\' 3"  (1.6 m)   BMI 44.57 kg/m   GENERAL: alert, oriented, appears well and in no acute distress  HEENT: atraumatic, conjunctiva clear, no obvious abnormalities on inspection of external nose and ears Nasal congestion. Maxillary sinus pain when she applies pressure on areas.  NECK: normal movements of the head and neck  LUNGS: on inspection no signs of respiratory distress, breathing rate appears normal, no obvious gross SOB, gasping or wheezing  CV: no obvious cyanosis  Kristy: moves all visible extremities without noticeable abnormality  PSYCH/NEURO: pleasant  and cooperative, no obvious depression or anxiety, speech and thought processing grossly intact  ASSESSMENT AND PLAN:  Discussed the following assessment and plan: Nasal sinus congestion We discussed possible etiologies, including viral illnesses and allergies. I do not think antibiotic is needed at this time, so recommend avoiding it.  I did send a prescription for Augmentin and instructed to start it in 5 days if she is not having any improvement at all. Nasal saline irrigations as needed. Prednisone 40 mg daily for 3 days recommended, she has taken medication in the past and he has been well-tolerated.  -     predniSONE; Take 2 tablets (40 mg total) by mouth daily with breakfast for 3 days.  Dispense: 6 tablet; Refill: 0 Other orders -     Amoxicillin-Pot Clavulanate; Take 1 tablet by mouth 2 (two) times daily for 7 days.  Dispense: 14 tablet; Refill: 0  Seasonal allergic rhinitis, unspecified trigger Resume Flonase nasal spray and uses daily as needed. Continue Astelin nasal spray 2-3 times per day. Nasal saline irrigations as needed. Prednisone 40 mg daily for 3 days will help with symptoms as well.  -     Fluticasone Propionate; Place 2 sprays into both nostrils daily.   Dispense: 16 g; Refill: 0 -     predniSONE; Take 2 tablets (40 mg total) by mouth daily with breakfast for 3 days.  Dispense: 6 tablet; Refill: 0  Mild intermittent asthma without complication  Currently asymptomatic. If cough gets worse she can start Albuterol inh 2 puff every 6 hours for a week then as needed for wheezing or shortness of breath.  Continue Flovent 44 mcg 1 puff bid.  We discussed possible serious and likely etiologies, options for evaluation and workup, limitations of telemedicine visit vs in person visit, treatment, treatment risks and precautions. The patient was advised to call back or seek an in-person evaluation if the symptoms worsen or if the condition fails to improve as anticipated. I discussed the assessment and treatment plan with the patient. The patient was provided an opportunity to ask questions and all were answered. The patient agreed with the plan and demonstrated an understanding of the instructions.  Return if symptoms worsen or fail to improve.  Michale Weikel G. [place], MD  [facility]. [place] office.

## 2022-03-27 ENCOUNTER — Other Ambulatory Visit: Payer: Self-pay | Admitting: *Deleted

## 2022-03-27 DIAGNOSIS — I10 Essential (primary) hypertension: Secondary | ICD-10-CM

## 2022-03-27 MED ORDER — HYDROCHLOROTHIAZIDE 25 MG PO TABS: 25.0000 mg | ORAL_TABLET | Freq: Every day | ORAL | 3 refills | Status: DC

## 2022-03-27 NOTE — Telephone Encounter (Signed)
Rx done. 

## 2022-04-27 ENCOUNTER — Other Ambulatory Visit: Payer: Self-pay | Admitting: Family

## 2022-04-27 DIAGNOSIS — F330 Major depressive disorder, recurrent, mild: Secondary | ICD-10-CM

## 2022-05-08 NOTE — Telephone Encounter (Signed)
Pt is requesting a refill on FLUoxetine (PROZAC) 10 MG capsule  [id number] [address], [place] Phone: [phone]  Fax: [phone]

## 2022-05-21 IMAGING — US US  BREAST BX W/ LOC DEV 1ST LESION IMG BX SPEC US GUIDE*R*
1 series · 12 of 13 positions shown · non-contrast
Comparison: Previous exam(s).
COMPARISON: Previous exam(s).

Addendum:
CLINICAL DATA: Patient with a RIGHT breast mass at the 1 o'clock
axis presents today for ultrasound-guided core biopsy.

EXAM:
ULTRASOUND GUIDED RIGHT BREAST CORE NEEDLE BIOPSY

[Series 1: us breast bx w/ loc dev 1st lesion img bx spec us  · 0.06mm/px · 12 of 13 slices shown]
[im 1/13]
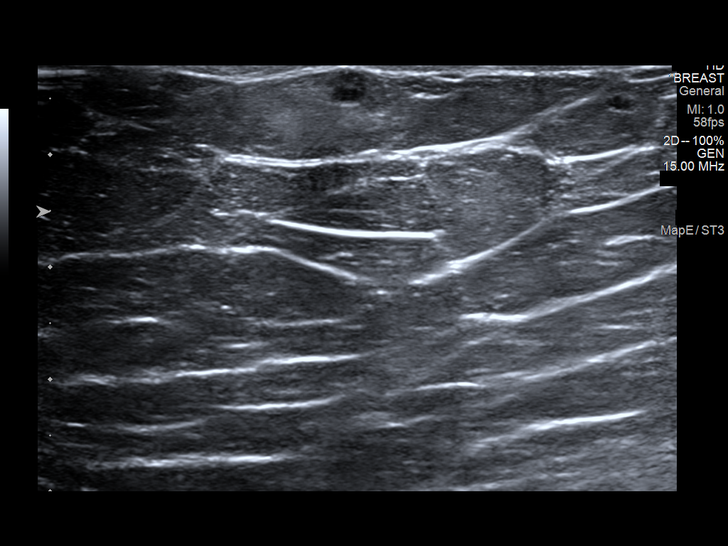
[im 2/13]
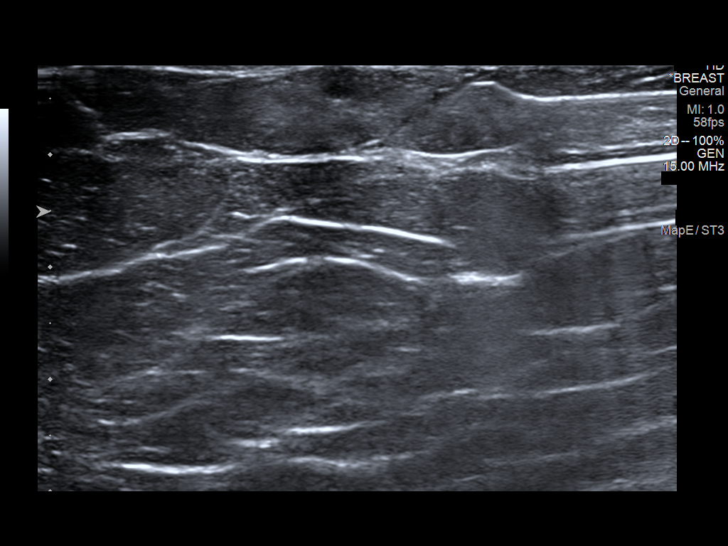
[im 3/13]
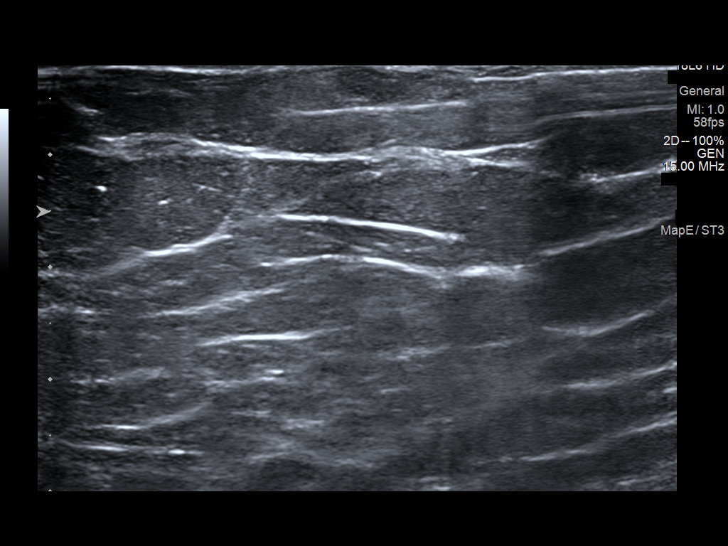
[im 4/13]
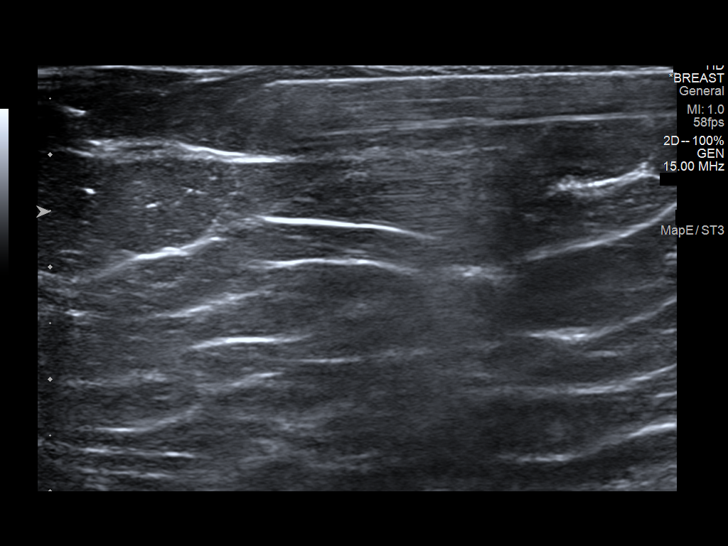
[im 5/13]
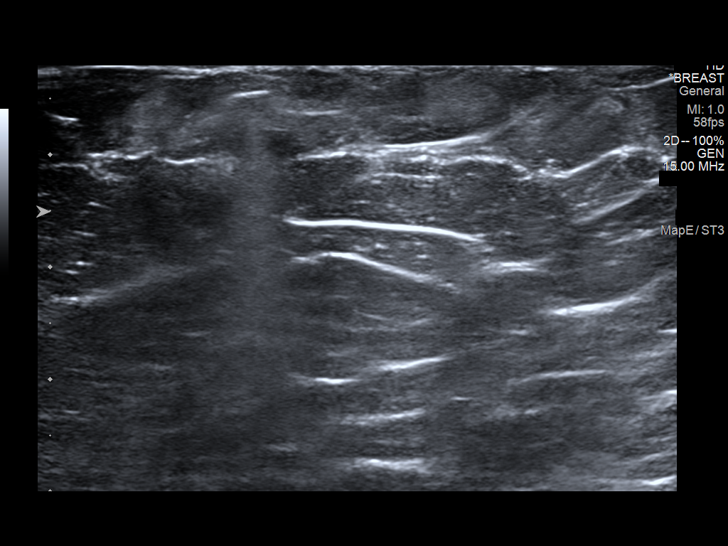
[im 6/13]
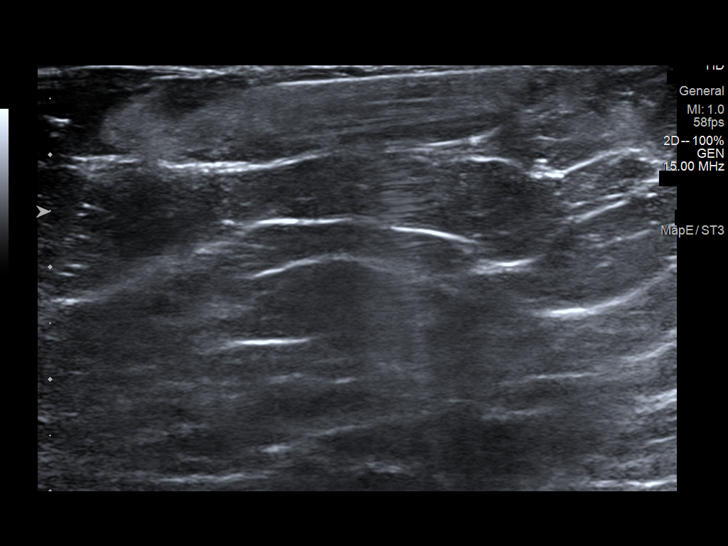
[im 8/13]
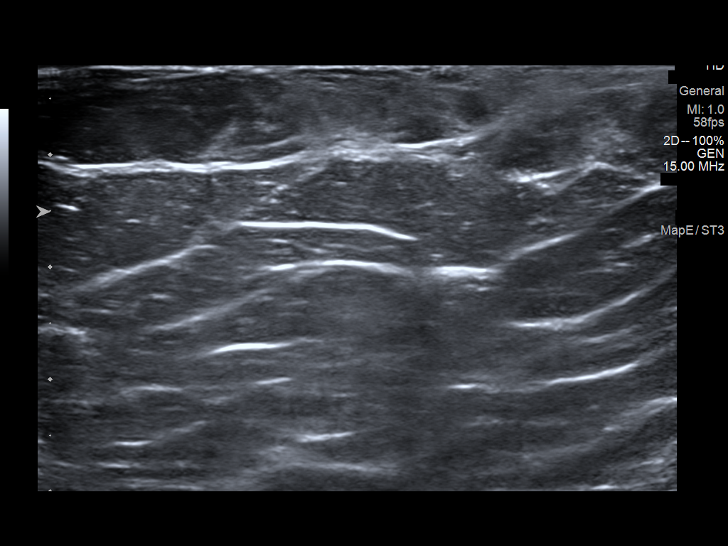
[im 9/13]
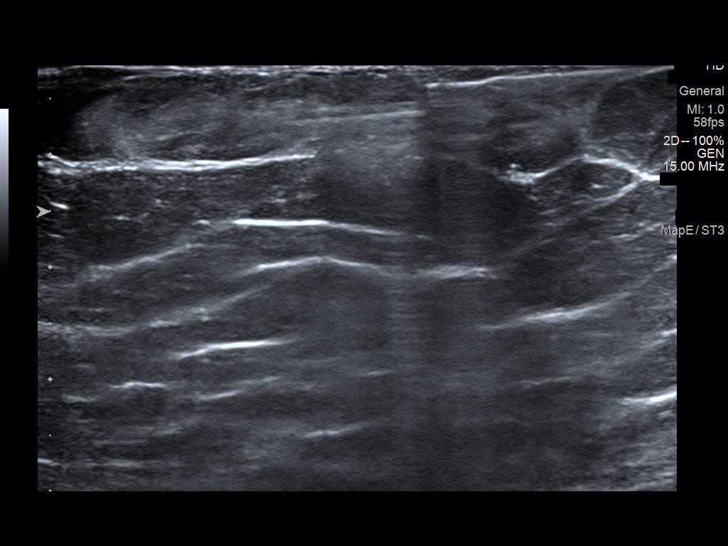
[im 10/13]
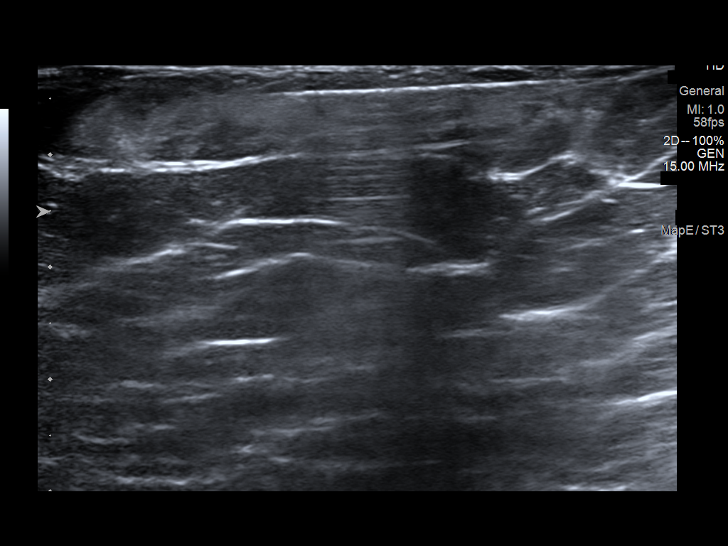
[im 11/13]
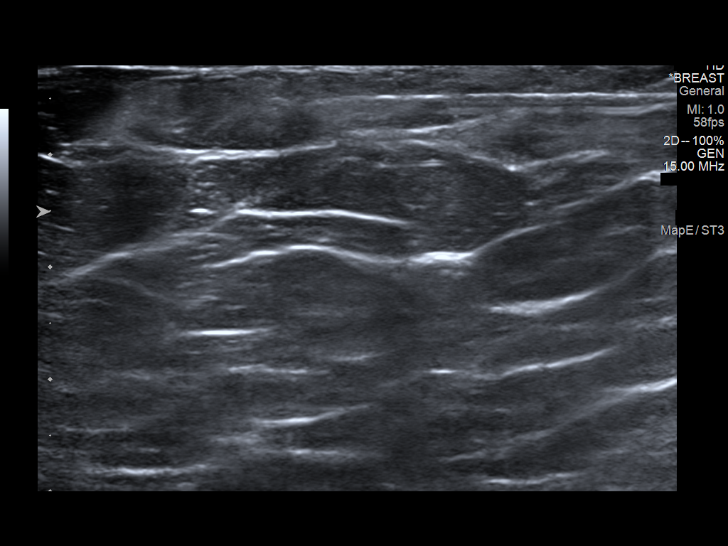
[im 12/13]
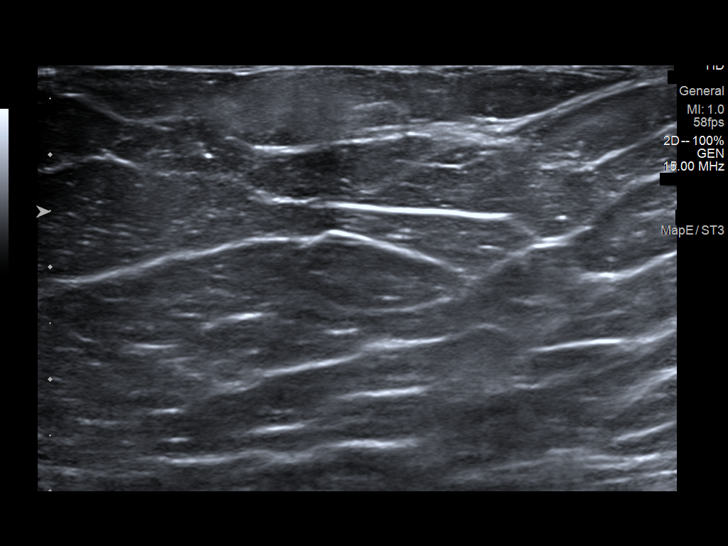
[im 13/13]
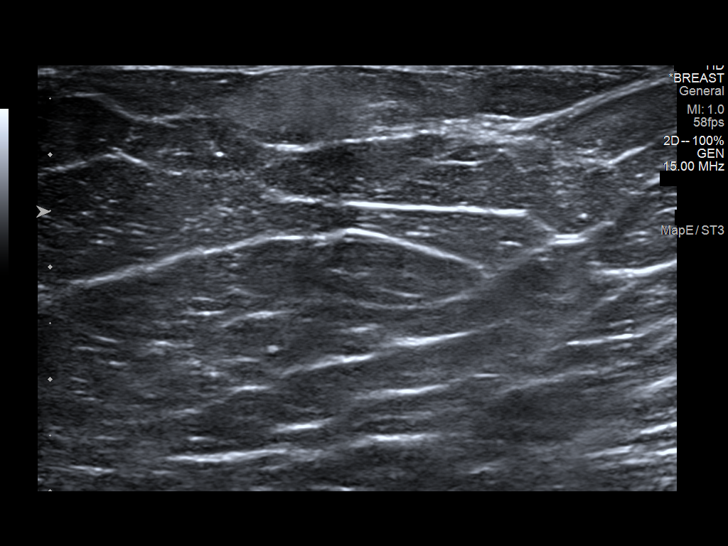

[12 of 13 positions shown; findings below may reference images not displayed]



Lesion quadrant: Upper inner quadrant

Using sterile technique and 1% Lidocaine as local anesthetic, under
direct ultrasound visualization, a 12 gauge Ocampo device was
used to perform biopsy of the RIGHT breast mass at the 1 o'clock
axis using a inferior approach. At the conclusion of the procedure
ribbon shaped tissue marker clip was deployed into the biopsy
cavity. Follow up 2 view mammogram was performed and dictated
separately.
IMPRESSION: Ultrasound guided biopsy of the RIGHT breast mass at the 1 o'clock
axis. No apparent complications.

ADDENDUM:
Pathology revealed FAT NECROSIS WITH INFLAMMATION- NO MALIGNANCY
IDENTIFIED of the RIGHT breast, 1 o'clock. This was found to be
concordant by Dr. Kohner Xi.

Pathology results were discussed with the patient by telephone. The
patient reported doing well after the biopsy with tenderness at the
site. Post biopsy instructions and care were reviewed and questions
were answered. The patient was encouraged to call The [REDACTED]

The patient was asked to return for right axillary ultrasound in 6
months to evaluate a probably benign RIGHT axillary lymph node
(mildly prominent in size but too close to axillary artery to
biopsy). The patient was informed a reminder notice would be sent
regarding this appointment.

Pathology results reported by Caleb Aujla RN on 08/09/2020.



Lesion quadrant: Upper inner quadrant

Using sterile technique and 1% Lidocaine as local anesthetic, under
direct ultrasound visualization, a 12 gauge Ocampo device was
used to perform biopsy of the RIGHT breast mass at the 1 o'clock
axis using a inferior approach. At the conclusion of the procedure
ribbon shaped tissue marker clip was deployed into the biopsy
cavity. Follow up 2 view mammogram was performed and dictated
separately.
IMPRESSION: Ultrasound guided biopsy of the RIGHT breast mass at the 1 o'clock
axis. No apparent complications.

## 2022-05-21 IMAGING — MG MM BREAST LOCALIZATION CLIP
4 series · 4 of 12 positions shown · non-contrast
Comparison: Previous exam(s).

CLINICAL DATA: Status post ultrasound-guided biopsy of a RIGHT
breast mass at the 1 o'clock axis.

EXAM:
DIAGNOSTIC RIGHT MAMMOGRAM POST ULTRASOUND BIOPSY

[R ML synth-2D]
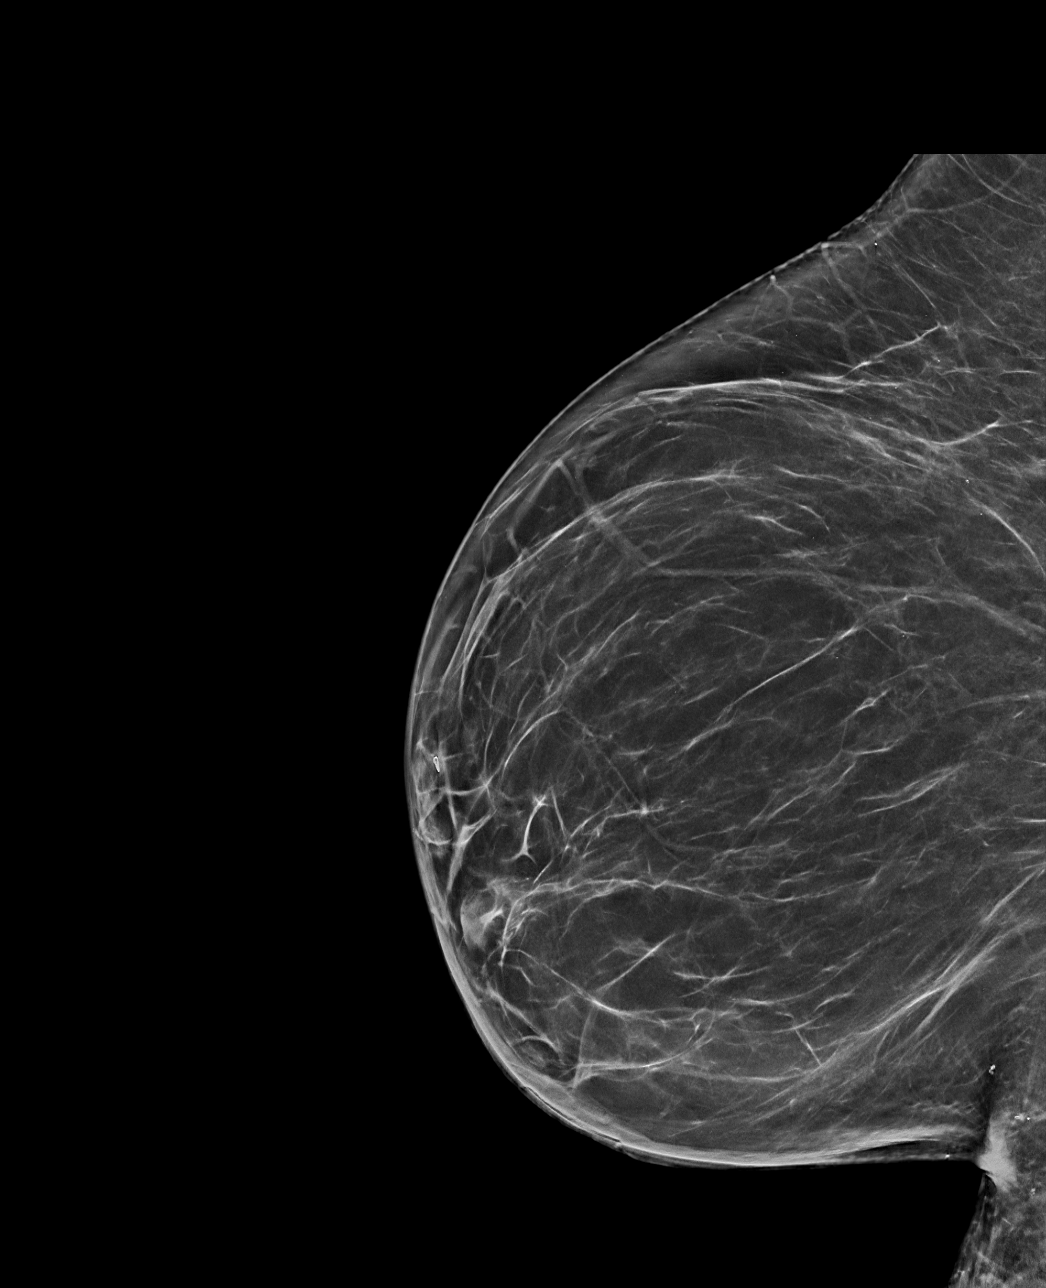

[R CC synth-2D]
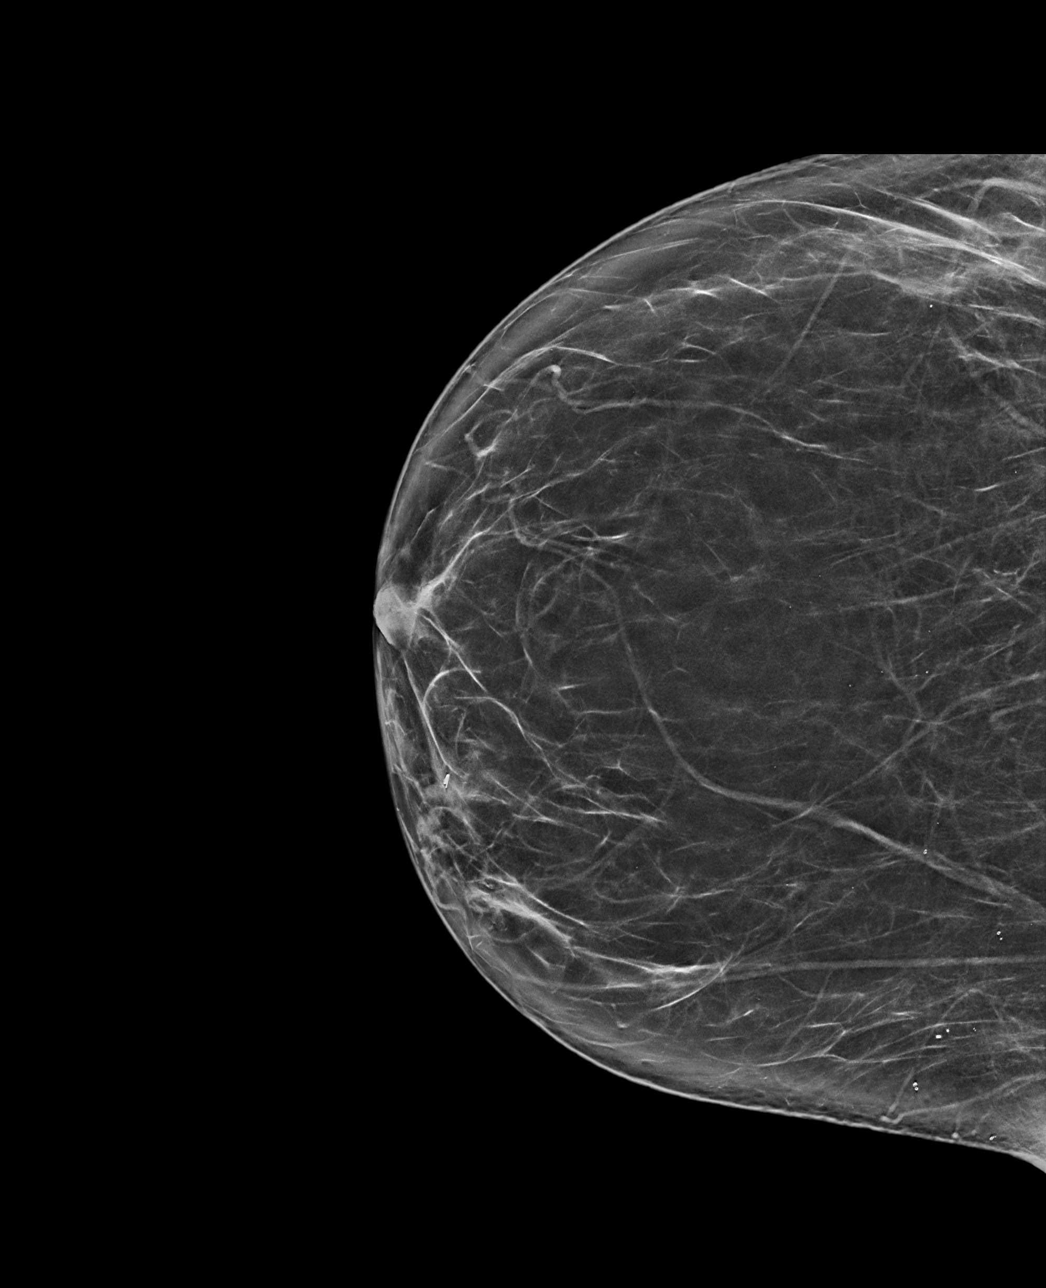

[R CC tomo · tomo slice 40/79.0]
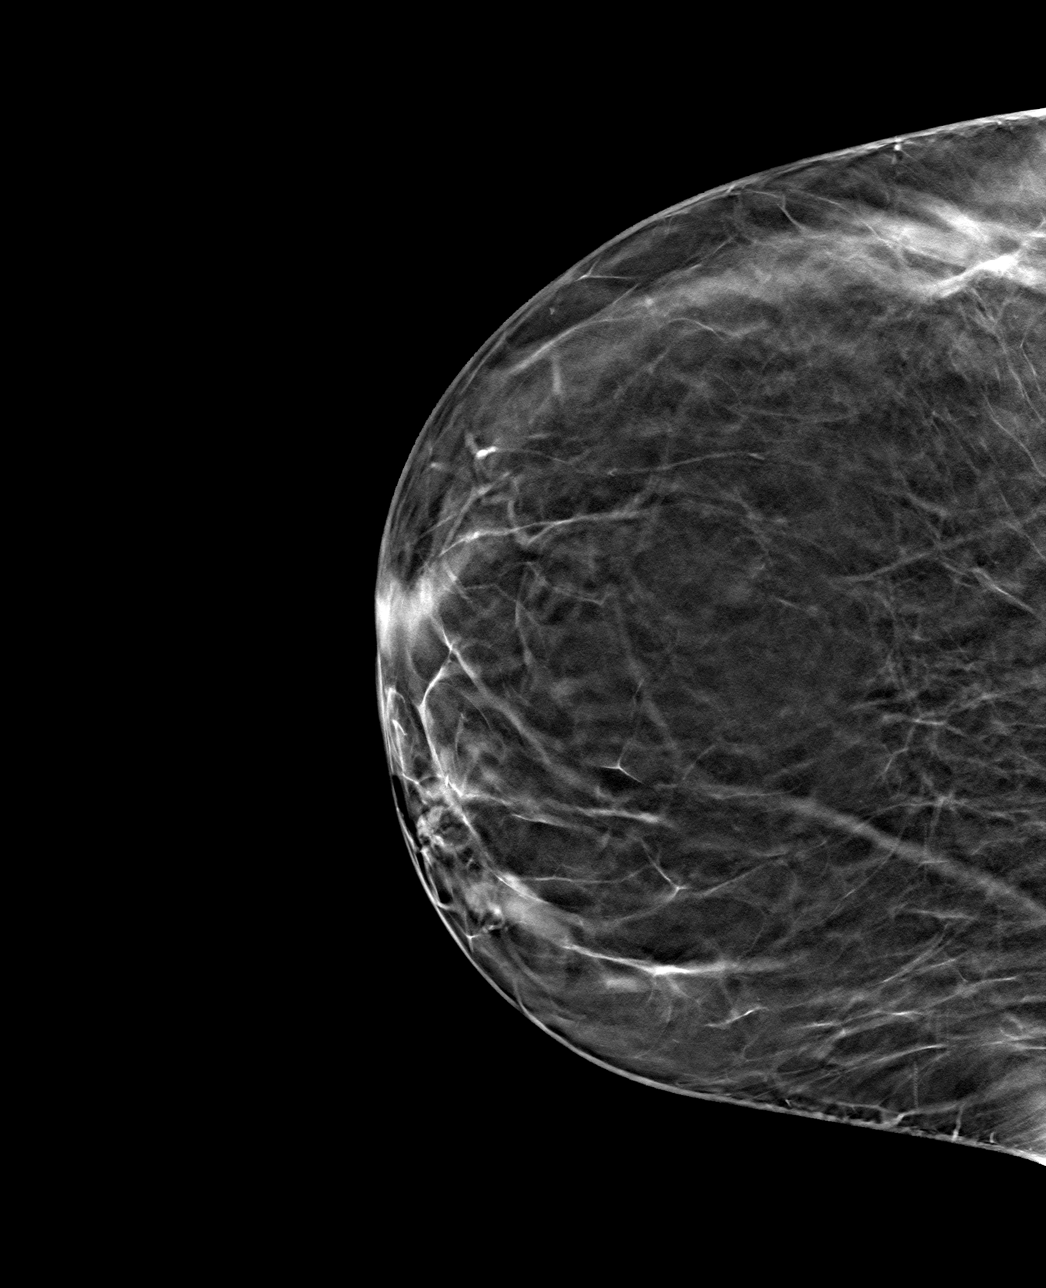

[R ML tomo · tomo slice 41/81.0]
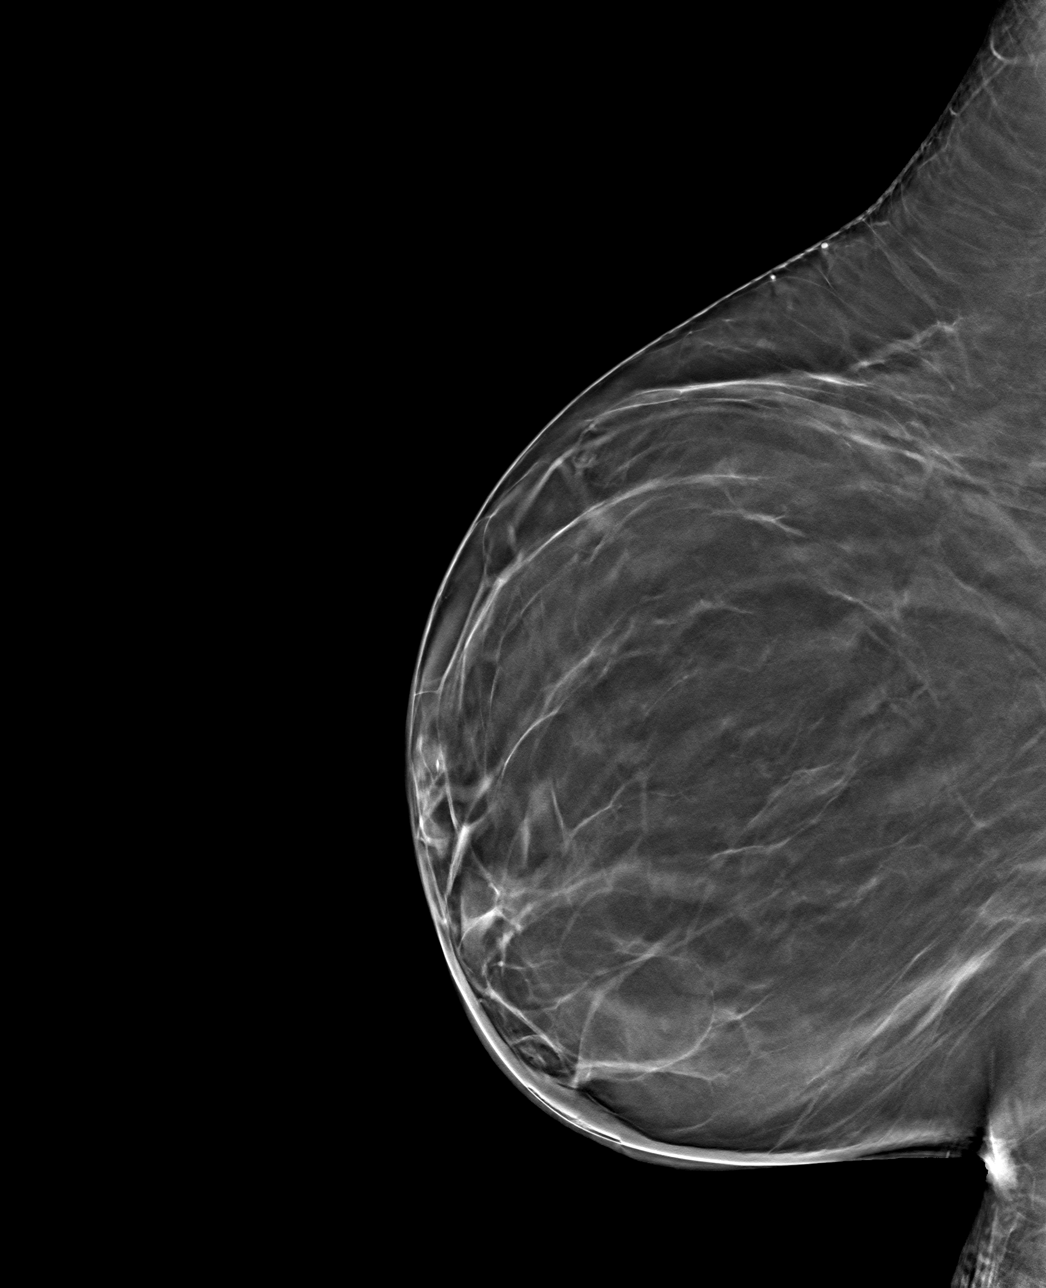

[4 of 12 positions shown; findings below may reference images not displayed]

FINDINGS: Mammographic images were obtained following ultrasound guided biopsy
of the RIGHT breast mass at the 1 o'clock axis. The biopsy marking
clip is in expected position at the site of biopsy.
IMPRESSION: Appropriate positioning of the ribbon shaped biopsy marking clip at
the site of biopsy in the upper inner quadrant of the RIGHT breast
corresponding to the targeted mass at the 1 o'clock axis.

Final Assessment: Post Procedure Mammograms for Marker Placement

## 2022-08-14 ENCOUNTER — Other Ambulatory Visit: Payer: Self-pay | Admitting: Family Medicine

## 2022-08-14 DIAGNOSIS — I10 Essential (primary) hypertension: Secondary | ICD-10-CM

## 2022-08-14 DIAGNOSIS — F330 Major depressive disorder, recurrent, mild: Secondary | ICD-10-CM

## 2022-08-16 ENCOUNTER — Other Ambulatory Visit: Payer: Self-pay | Admitting: Family Medicine

## 2022-08-16 DIAGNOSIS — I10 Essential (primary) hypertension: Secondary | ICD-10-CM

## 2022-09-17 ENCOUNTER — Other Ambulatory Visit: Payer: Self-pay | Admitting: Family Medicine

## 2022-09-17 DIAGNOSIS — I10 Essential (primary) hypertension: Secondary | ICD-10-CM

## 2022-09-17 DIAGNOSIS — F330 Major depressive disorder, recurrent, mild: Secondary | ICD-10-CM

## 2022-10-11 ENCOUNTER — Other Ambulatory Visit: Payer: Self-pay | Admitting: Family Medicine

## 2022-10-11 DIAGNOSIS — I10 Essential (primary) hypertension: Secondary | ICD-10-CM

## 2022-11-05 ENCOUNTER — Telehealth: Payer: Self-pay | Admitting: *Deleted

## 2022-11-05 MED ORDER — FLUTICASONE PROPIONATE HFA 44 MCG/ACT IN AERO: INHALATION_SPRAY | RESPIRATORY_TRACT | 1 refills | Status: AC

## 2022-11-05 NOTE — Telephone Encounter (Signed)
Rx done. 

## 2022-12-18 ENCOUNTER — Other Ambulatory Visit: Payer: Self-pay | Admitting: Family Medicine

## 2022-12-18 DIAGNOSIS — I10 Essential (primary) hypertension: Secondary | ICD-10-CM

## 2023-01-09 ENCOUNTER — Telehealth: Payer: Self-pay | Admitting: *Deleted

## 2023-01-09 DIAGNOSIS — I10 Essential (primary) hypertension: Secondary | ICD-10-CM

## 2023-01-09 MED ORDER — HYDROCHLOROTHIAZIDE 25 MG PO TABS
25.0000 mg | ORAL_TABLET | Freq: Every day | ORAL | 0 refills | Status: AC
Start: 2023-01-09 — End: ?

## 2023-01-09 NOTE — Telephone Encounter (Signed)
Rx done. 

## 2023-01-19 ENCOUNTER — Other Ambulatory Visit: Payer: Self-pay | Admitting: Family Medicine

## 2023-01-19 DIAGNOSIS — I10 Essential (primary) hypertension: Secondary | ICD-10-CM

## 2023-02-08 ENCOUNTER — Other Ambulatory Visit: Payer: Self-pay | Admitting: Family Medicine

## 2023-02-08 DIAGNOSIS — I10 Essential (primary) hypertension: Secondary | ICD-10-CM
